# Patient Record
Sex: Male | Born: 1960 | Race: White | Hispanic: No | Marital: Married | Smoking: Current some day smoker
Health system: Southern US, Community
[De-identification: ages and names within clinical notes are randomized; demographics above are authoritative.]

## PROBLEM LIST (undated history)

## (undated) DIAGNOSIS — M1990 Unspecified osteoarthritis, unspecified site: Secondary | ICD-10-CM

## (undated) DIAGNOSIS — G709 Myoneural disorder, unspecified: Secondary | ICD-10-CM

## (undated) DIAGNOSIS — K219 Gastro-esophageal reflux disease without esophagitis: Secondary | ICD-10-CM

## (undated) DIAGNOSIS — E785 Hyperlipidemia, unspecified: Secondary | ICD-10-CM

## (undated) DIAGNOSIS — I10 Essential (primary) hypertension: Secondary | ICD-10-CM

## (undated) DIAGNOSIS — T8859XA Other complications of anesthesia, initial encounter: Secondary | ICD-10-CM

## (undated) DIAGNOSIS — T4145XA Adverse effect of unspecified anesthetic, initial encounter: Secondary | ICD-10-CM

## (undated) HISTORY — PX: MASTECTOMY: SHX3

---

## 2000-05-02 HISTORY — PX: PAROTIDECTOMY: SUR1003

## 2009-05-02 HISTORY — PX: HERNIA REPAIR: SHX51

## 2010-02-11 ENCOUNTER — Ambulatory Visit: Payer: Self-pay | Admitting: Surgery

## 2011-05-12 ENCOUNTER — Other Ambulatory Visit: Payer: Self-pay | Admitting: Orthopedic Surgery

## 2011-05-18 ENCOUNTER — Encounter (HOSPITAL_BASED_OUTPATIENT_CLINIC_OR_DEPARTMENT_OTHER): Payer: Self-pay | Admitting: *Deleted

## 2011-05-18 NOTE — Progress Notes (Signed)
Able to finally get 4pm-works nights To arrive 745am to get lab and ekg

## 2011-05-19 ENCOUNTER — Ambulatory Visit (HOSPITAL_BASED_OUTPATIENT_CLINIC_OR_DEPARTMENT_OTHER)
Admission: RE | Admit: 2011-05-19 | Discharge: 2011-05-19 | Disposition: A | Discharge status: Home / Self Care | Payer: Federal, State, Local not specified - PPO | Source: Ambulatory Visit | Attending: Orthopedic Surgery | Admitting: Orthopedic Surgery

## 2011-05-19 ENCOUNTER — Encounter (HOSPITAL_BASED_OUTPATIENT_CLINIC_OR_DEPARTMENT_OTHER): Payer: Self-pay | Admitting: *Deleted

## 2011-05-19 ENCOUNTER — Encounter (HOSPITAL_BASED_OUTPATIENT_CLINIC_OR_DEPARTMENT_OTHER): Payer: Self-pay | Admitting: Anesthesiology

## 2011-05-19 ENCOUNTER — Ambulatory Visit (HOSPITAL_BASED_OUTPATIENT_CLINIC_OR_DEPARTMENT_OTHER): Payer: Federal, State, Local not specified - PPO | Admitting: Anesthesiology

## 2011-05-19 ENCOUNTER — Encounter (HOSPITAL_BASED_OUTPATIENT_CLINIC_OR_DEPARTMENT_OTHER)
Admission: RE | Disposition: A | Discharge status: Home / Self Care | Payer: Self-pay | Source: Ambulatory Visit | Attending: Orthopedic Surgery

## 2011-05-19 ENCOUNTER — Encounter (HOSPITAL_BASED_OUTPATIENT_CLINIC_OR_DEPARTMENT_OTHER): Payer: Self-pay | Admitting: Orthopedic Surgery

## 2011-05-19 ENCOUNTER — Other Ambulatory Visit: Payer: Self-pay

## 2011-05-19 DIAGNOSIS — G5600 Carpal tunnel syndrome, unspecified upper limb: Secondary | ICD-10-CM | POA: Insufficient documentation

## 2011-05-19 DIAGNOSIS — I10 Essential (primary) hypertension: Secondary | ICD-10-CM | POA: Insufficient documentation

## 2011-05-19 DIAGNOSIS — E785 Hyperlipidemia, unspecified: Secondary | ICD-10-CM | POA: Insufficient documentation

## 2011-05-19 HISTORY — DX: Other complications of anesthesia, initial encounter: T88.59XA

## 2011-05-19 HISTORY — DX: Myoneural disorder, unspecified: G70.9

## 2011-05-19 HISTORY — PX: CARPAL TUNNEL RELEASE: SHX101

## 2011-05-19 HISTORY — DX: Hyperlipidemia, unspecified: E78.5

## 2011-05-19 HISTORY — DX: Adverse effect of unspecified anesthetic, initial encounter: T41.45XA

## 2011-05-19 HISTORY — DX: Essential (primary) hypertension: I10

## 2011-05-19 LAB — POCT I-STAT, CHEM 8
BUN: 10 mg/dL (ref 6–23)
Creatinine, Ser: 1 mg/dL (ref 0.50–1.35)
Hemoglobin: 16 g/dL (ref 13.0–17.0)
Potassium: 4.2 mEq/L (ref 3.5–5.1)
Sodium: 136 mEq/L (ref 135–145)

## 2011-05-19 SURGERY — CARPAL TUNNEL RELEASE
Anesthesia: General | Site: Wrist | Laterality: Right | Wound class: Clean

## 2011-05-19 MED ORDER — LIDOCAINE HCL (CARDIAC) 20 MG/ML IV SOLN
INTRAVENOUS | Status: DC | PRN
  Administered 2011-05-19: 60 mg via INTRAVENOUS

## 2011-05-19 MED ORDER — CHLORHEXIDINE GLUCONATE 4 % EX LIQD: 60.0000 mL | Freq: Once | CUTANEOUS | Status: DC

## 2011-05-19 MED ORDER — LACTATED RINGERS IV SOLN
INTRAVENOUS | Status: DC
  Administered 2011-05-19 (×2): via INTRAVENOUS

## 2011-05-19 MED ORDER — ONDANSETRON HCL 4 MG/2ML IJ SOLN
INTRAMUSCULAR | Status: DC | PRN
  Administered 2011-05-19: 4 mg via INTRAVENOUS

## 2011-05-19 MED ORDER — MEPERIDINE HCL 25 MG/ML IJ SOLN: 6.2500 mg | INTRAMUSCULAR | Status: DC | PRN

## 2011-05-19 MED ORDER — MIDAZOLAM HCL 5 MG/5ML IJ SOLN
INTRAMUSCULAR | Status: DC | PRN
  Administered 2011-05-19: 2 mg via INTRAVENOUS

## 2011-05-19 MED ORDER — LIDOCAINE HCL 2 % IJ SOLN
INTRAMUSCULAR | Status: DC | PRN
  Administered 2011-05-19: 4 mL

## 2011-05-19 MED ORDER — FENTANYL CITRATE 0.05 MG/ML IJ SOLN
INTRAMUSCULAR | Status: DC | PRN
  Administered 2011-05-19: 100 ug via INTRAVENOUS

## 2011-05-19 MED ORDER — PROPOFOL 10 MG/ML IV EMUL
INTRAVENOUS | Status: DC | PRN
  Administered 2011-05-19: 200 mg via INTRAVENOUS

## 2011-05-19 MED ORDER — PROMETHAZINE HCL 25 MG/ML IJ SOLN: 6.2500 mg | INTRAMUSCULAR | Status: DC | PRN

## 2011-05-19 MED ORDER — FENTANYL CITRATE 0.05 MG/ML IJ SOLN: 25.0000 ug | INTRAMUSCULAR | Status: DC | PRN

## 2011-05-19 MED ORDER — HYDROCODONE-ACETAMINOPHEN 5-325 MG PO TABS: ORAL_TABLET | ORAL | Status: AC

## 2011-05-19 MED ORDER — DEXAMETHASONE SODIUM PHOSPHATE 10 MG/ML IJ SOLN
INTRAMUSCULAR | Status: DC | PRN
  Administered 2011-05-19: 8 mg via INTRAVENOUS

## 2011-05-19 SURGICAL SUPPLY — 41 items
BANDAGE ADHESIVE 1X3 (GAUZE/BANDAGES/DRESSINGS) IMPLANT
BANDAGE ELASTIC 3 VELCRO ST LF (GAUZE/BANDAGES/DRESSINGS) ×2 IMPLANT
BLADE SURG 15 STRL LF DISP TIS (BLADE) ×1 IMPLANT
BLADE SURG 15 STRL SS (BLADE) ×1
BNDG ESMARK 4X9 LF (GAUZE/BANDAGES/DRESSINGS) ×2 IMPLANT
BRUSH SCRUB EZ PLAIN DRY (MISCELLANEOUS) ×2 IMPLANT
CLOSURE STERI STRIP 1/2 X4 (GAUZE/BANDAGES/DRESSINGS) ×2 IMPLANT
CLOTH BEACON ORANGE TIMEOUT ST (SAFETY) ×2 IMPLANT
CORDS BIPOLAR (ELECTRODE) ×2 IMPLANT
COVER MAYO STAND STRL (DRAPES) ×2 IMPLANT
COVER TABLE BACK 60X90 (DRAPES) ×2 IMPLANT
CUFF TOURNIQUET SINGLE 18IN (TOURNIQUET CUFF) IMPLANT
DECANTER SPIKE VIAL GLASS SM (MISCELLANEOUS) ×2 IMPLANT
DRAPE EXTREMITY T 121X128X90 (DRAPE) ×2 IMPLANT
DRAPE SURG 17X23 STRL (DRAPES) ×2 IMPLANT
GLOVE BIO SURGEON STRL SZ 6.5 (GLOVE) ×4 IMPLANT
GLOVE BIOGEL M STRL SZ7.5 (GLOVE) ×2 IMPLANT
GLOVE ORTHO TXT STRL SZ7.5 (GLOVE) ×2 IMPLANT
GOWN PREVENTION PLUS XLARGE (GOWN DISPOSABLE) ×2 IMPLANT
GOWN PREVENTION PLUS XXLARGE (GOWN DISPOSABLE) ×4 IMPLANT
NEEDLE 27GAX1X1/2 (NEEDLE) ×2 IMPLANT
PACK BASIN DAY SURGERY FS (CUSTOM PROCEDURE TRAY) ×2 IMPLANT
PAD CAST 3X4 CTTN HI CHSV (CAST SUPPLIES) ×1 IMPLANT
PADDING CAST ABS 4INX4YD NS (CAST SUPPLIES) ×1
PADDING CAST ABS COTTON 4X4 ST (CAST SUPPLIES) ×1 IMPLANT
PADDING CAST COTTON 3X4 STRL (CAST SUPPLIES) ×1
PADDING WEBRIL 3 STERILE (GAUZE/BANDAGES/DRESSINGS) ×2 IMPLANT
SPLINT PLASTER CAST XFAST 3X15 (CAST SUPPLIES) ×5 IMPLANT
SPLINT PLASTER EXTRA FAST 3X15 (CAST SUPPLIES) ×1
SPLINT PLASTER GYPS XFAST 3X15 (CAST SUPPLIES) ×1 IMPLANT
SPLINT PLASTER XTRA FASTSET 3X (CAST SUPPLIES) ×5
SPONGE GAUZE 4X4 12PLY (GAUZE/BANDAGES/DRESSINGS) ×2 IMPLANT
SPONGE GAUZE 4X4 16PLY NONSTR (GAUZE/BANDAGES/DRESSINGS) ×2 IMPLANT
STOCKINETTE 4X48 STRL (DRAPES) ×2 IMPLANT
STRIP CLOSURE SKIN 1/2X4 (GAUZE/BANDAGES/DRESSINGS) ×2 IMPLANT
SUT PROLENE 3 0 PS 2 (SUTURE) ×2 IMPLANT
SYR 3ML 23GX1 SAFETY (SYRINGE) IMPLANT
SYR CONTROL 10ML LL (SYRINGE) ×2 IMPLANT
TRAY DSU PREP LF (CUSTOM PROCEDURE TRAY) ×2 IMPLANT
UNDERPAD 30X30 INCONTINENT (UNDERPADS AND DIAPERS) ×2 IMPLANT
WATER STERILE IRR 1000ML POUR (IV SOLUTION) ×2 IMPLANT

## 2011-05-19 NOTE — Anesthesia Procedure Notes (Addendum)
Date/Time: 05/19/2011 10:07 AM Performed by: Radford Pax   Procedure Name: LMA Insertion Date/Time: 05/19/2011 10:07 AM Performed by: Radford Pax Pre-anesthesia Checklist: Patient identified, Emergency Drugs available, Suction available, Patient being monitored and Timeout performed Patient Re-evaluated:Patient Re-evaluated prior to inductionOxygen Delivery Method: Circle System Utilized Preoxygenation: Pre-oxygenation with 100% oxygen Intubation Type: IV induction Ventilation: Mask ventilation without difficulty LMA: LMA with gastric port inserted LMA Size: 4.0 Number of attempts: 1 (atraumatic) Placement Confirmation: positive ETCO2 Tube secured with: Tape Dental Injury: Teeth and Oropharynx as per pre-operative assessment

## 2011-05-19 NOTE — Transfer of Care (Signed)
Immediate Anesthesia Transfer of Care Note  Patient: Raymond Cooley  Procedure(s) Performed:  CARPAL TUNNEL RELEASE  Patient Location: PACU  Anesthesia Type: General  Level of Consciousness: awake, alert , oriented and patient cooperative  Airway & Oxygen Therapy: Patient Spontanous Breathing and Patient connected to face mask oxygen  Post-op Assessment: Report given to PACU RN and Post -op Vital signs reviewed and stable  Post vital signs: Reviewed and stable Filed Vitals:   05/19/11 0830  BP: 118/75  Pulse:   Temp:   Resp: 20    Complications: No apparent anesthesia complications

## 2011-05-19 NOTE — H&P (Signed)
Raymond Cooley is an 51 y.o. male.   Chief Complaint: Complaining of chronic and progressive right hand numbness and tingling HPI: Patient is a 51 year old right-hand-dominant male who presented to our office in early January complaining of a 5+ year history of chronic and progressive numbness and tingling in both hands. He had nerve conduction studies that revealed a moderate to severe bilateral carpal tunnel syndrome. She's tried a long of conservative measures including splinting and anti-inflammatories. This continues to awaken the patient at night. He wishes to proceed with surgical intervention.  Past Medical History  Diagnosis Date  . Hypertension   . Hyperlipemia   . Complication of anesthesia     difficulty voiding  . Neuromuscular disorder     carpal tunnel    Past Surgical History  Procedure Date  . Hernia repair 2011    rupured umb hernia-  . Parotidectomy 2002    rt    History reviewed. No pertinent family history. Social History:  reports that he has been smoking.  He does not have any smokeless tobacco history on file. He reports that he drinks alcohol. He reports that he does not use illicit drugs.  Allergies: No Known Allergies  Medications Prior to Admission  Medication Dose Route Frequency Provider Last Rate Last Dose  . chlorhexidine (HIBICLENS) 4 % liquid 4 application  60 mL Topical Once       . lactated ringers infusion   Intravenous Continuous Germaine Pomfret, MD 20 mL/hr at 05/19/11 0855     Medications Prior to Admission  Medication Sig Dispense Refill  . acetaminophen (TYLENOL) 500 MG tablet Take 500 mg by mouth every 6 (six) hours as needed.      . gabapentin (NEURONTIN) 400 MG capsule Take 400 mg by mouth 2 times daily at 12 noon and 4 pm.      . ibuprofen (ADVIL,MOTRIN) 400 MG tablet Take 400 mg by mouth every 6 (six) hours as needed.      Marland Kitchen lisinopril (PRINIVIL,ZESTRIL) 10 MG tablet Take 10 mg by mouth daily.      Marland Kitchen omeprazole (PRILOSEC) 20  MG capsule Take 20 mg by mouth daily.        No results found for this or any previous visit (from the past 48 hour(s)).  No results found.   Pertinent items are noted in HPI.  Blood pressure 118/75, pulse 65, temperature 97.6 F (36.4 C), temperature source Oral, resp. rate 20, height 5\' 11"  (1.803 m), weight 133.811 kg (295 lb), SpO2 95.00%.  General appearance: alert Head: Normocephalic, without obvious abnormality Neck: supple, symmetrical, trachea midline Resp: clear to auscultation bilaterally Cardio: regular rate and rhythm, S1, S2 normal, no murmur, click, rub or gallop GI: normal findings: bowel sounds normal Extremities: Examination of his hands reveals a positive Phalen's and positive Tinel's bilaterally. He has full range of motion of his fingers and wrist. He has normal sweat pattern. Review of his nerve conduction studies reveals a moderate to severe bilateral carpal tunnel syndrome. Pulses: 2+ and symmetric Skin: normal Neurologic: Grossly normal    Assessment/Plan Impression is bilateral carpal tunnel syndrome  Plan patient to be taken to the operating room to undergo right carpal tunnel release. The procedure risks benefits and postoperative course were discussed with the patient at length and he was in agreement with this plan.  DASNOIT,Dushaun Okey J 05/19/2011, 8:59 AM    H&P documentation: 05/19/2011  -History and Physical Reviewed  -Patient has been re-examined  -No change in the  plan of care  Wyn Forster, MD

## 2011-05-19 NOTE — Anesthesia Postprocedure Evaluation (Signed)
  Anesthesia Post-op Note  Patient: Raymond Cooley  Procedure(s) Performed:  CARPAL TUNNEL RELEASE  Patient Location: PACU  Anesthesia Type: General  Level of Consciousness: awake and alert   Airway and Oxygen Therapy: Patient Spontanous Breathing and Patient connected to face mask oxygen  Post-op Pain: mild  Post-op Assessment: Post-op Vital signs reviewed, Patient's Cardiovascular Status Stable, Respiratory Function Stable and Patent Airway  Post-op Vital Signs: Reviewed and stable  Complications: No apparent anesthesia complications

## 2011-05-19 NOTE — Brief Op Note (Signed)
05/19/2011  10:22 AM  PATIENT:  Raymond Cooley  51 y.o. male  PRE-OPERATIVE DIAGNOSIS:  right carpal tunnel syndrome (severe)  POST-OPERATIVE DIAGNOSIS:  Right carpal tunnel syndrome (severe)  PROCEDURE:  Procedure(s): CARPAL TUNNEL RELEASE  RIGHT HAND  SURGEON:  Surgeon(s): Wyn Forster., MD  PHYSICIAN ASSISTANT:   ASSISTANTS: Mallory Shirk.A-C   ANESTHESIA:   general  EBL:  Total I/O In: 500 [I.V.:500] Out: -   BLOOD ADMINISTERED:none  DRAINS: none   LOCAL MEDICATIONS USED:  XYLOCAINE 3 CC  SPECIMEN:  No Specimen  DISPOSITION OF SPECIMEN:  N/A  COUNTS:  YES  TOURNIQUET:  * Missing tourniquet times found for documented tourniquets in log:  [id number] *  DICTATION: .Other Dictation: Dictation Number [id number]  PLAN OF CARE: Discharge to home after PACU  PATIENT DISPOSITION:  PACU - hemodynamically stable.   Delay start of Pharmacological VTE agent (>24hrs) due to surgical blood loss or risk of bleeding:  NO/NOT APPLICABLE

## 2011-05-19 NOTE — Op Note (Signed)
Op note dictated:  05/19/11  [id number]

## 2011-05-19 NOTE — Anesthesia Preprocedure Evaluation (Signed)
Anesthesia Evaluation  Patient identified by MRN, date of birth, ID band Patient awake    Reviewed: Allergy & Precautions, H&P , NPO status , Patient's Chart, lab work & pertinent test results  Airway Mallampati: II TM Distance: >3 FB Neck ROM: Full    Dental No notable dental hx. (+) Teeth Intact   Pulmonary neg pulmonary ROS,  clear to auscultation  Pulmonary exam normal       Cardiovascular hypertension, On Medications Regular Normal    Neuro/Psych  Neuromuscular disease Negative Neurological ROS  Negative Psych ROS   GI/Hepatic negative GI ROS, Neg liver ROS,   Endo/Other  Negative Endocrine ROSMorbid obesity  Renal/GU negative Renal ROS  Genitourinary negative   Musculoskeletal   Abdominal (+) obese,   Peds  Hematology negative hematology ROS (+)   Anesthesia Other Findings   Reproductive/Obstetrics negative OB ROS                           Anesthesia Physical Anesthesia Plan  ASA: III  Anesthesia Plan: General   Post-op Pain Management:    Induction: Intravenous  Airway Management Planned: LMA  Additional Equipment:   Intra-op Plan:   Post-operative Plan: Extubation in OR  Informed Consent: I have reviewed the patients History and Physical, chart, labs and discussed the procedure including the risks, benefits and alternatives for the proposed anesthesia with the patient or authorized representative who has indicated his/her understanding and acceptance.     Plan Discussed with: CRNA and Surgeon  Anesthesia Plan Comments:         Anesthesia Quick Evaluation

## 2011-05-20 NOTE — Op Note (Signed)
NAME:  Raymond Cooley, Raymond Cooley NO.:  [id number]  MEDICAL RECORD NO.:  [id number]  LOCATION:                                 FACILITY:  PHYSICIAN:  Katy Fitch. Jenkins Risdon, M.D.      DATE OF BIRTH:  DATE OF PROCEDURE:  05/19/2011 DATE OF DISCHARGE:                              OPERATIVE REPORT   PREOPERATIVE DIAGNOSIS:  Chronic severe right carpal tunnel syndrome.  POSTOPERATIVE DIAGNOSIS:  Chronic severe right carpal tunnel syndrome.  OPERATION:  Release of right transverse carpal ligament.  OPERATING SURGEON:  Katy Fitch. Shawntell Dixson, MD  ASSISTANT:  Annye Rusk, PA-C  ANESTHESIA:  General by LMA.  SUPERVISING ANESTHESIOLOGIST:  Zenon Mayo, MD  INDICATIONS:  Niv Darley is a 51 year old gentleman referred through the courtesy of Dr. Evelene Croon of the [facility] for evaluation and management of bilateral hand numbness. 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  Electrodiagnostic studies revealed severe carpal tunnel syndrome on the right, moderately severe on the left.  We advised Mr. Eisenhour to undergo release of his right transverse carpal ligament.  Preoperatively, he was reminded of the potential risks and benefits of surgery.  Question regarding the anticipated procedure were invited and answered in detail.  He understands he will need to wait 3-4 months to see the full benefit of surgery due to his severe entrapment neuropathy at this time.  Preoperatively, he was interviewed in the holding area.  Questions were invited and answered in detail.  PROCEDURE:  Drew Lips was brought to room 1 of the [facility] and placed in supine position upon the operating table.  Following the induction of general anesthesia by LMA technique under Dr. Jarrett Ables direct supervision, the right arm was prepped with Betadine soap and solution and sterilely draped.  A  pneumatic tourniquet was applied to the proximal right brachium.  Following exsanguination of the right arm with Esmarch bandage, the arterial tourniquet was inflated to 220 mmHg.  Procedure commenced with a routine surgical time-out.  A short incision was fashioned in line of the ring finger in the palm. Subcutaneous tissues were carefully divided revealing the palmar fascia. This was split in line of its fibers to reveal the common sensory branches of the median nerve and the distal margin of the transverse carpal ligament.  The carpal canal was sounded with a Penfield 4 elevator followed by release of the transverse carpal ligament along its ulnar border extending into the distal forearm.  This widely opened the carpal canal.  No masses or other predicaments were noted.  Bleeding points along the margin of the released ligament were electrocauterized with bipolar current followed by repair of the skin with intradermal 3-0 Prolene suture.  A compressive dressing was applied with a volar plaster splint maintaining the wrist in 5 degrees of dorsiflexion.  For aftercare, Mr. Chiappetta  is provided prescription for hydrocodone and Tylenol 5/325 one p.o. q.4-6 h. p.r.n. pain, 20 tablets without refill.     Katy Fitch Janki Dike, M.D.     RVS/MEDQ  D:  05/19/2011  T:  05/20/2011  Job:  [id number]  cc:   Evelene Croon, MD

## 2011-05-23 ENCOUNTER — Encounter (HOSPITAL_BASED_OUTPATIENT_CLINIC_OR_DEPARTMENT_OTHER): Payer: Self-pay | Admitting: Orthopedic Surgery

## 2011-05-23 NOTE — Addendum Note (Signed)
Addendum  created 05/23/11 1010 by Jewel Baize Annabell Oconnor, CRNA   Modules edited:Anesthesia Events

## 2011-06-06 ENCOUNTER — Emergency Department (HOSPITAL_COMMUNITY): Payer: Federal, State, Local not specified - PPO

## 2011-06-06 ENCOUNTER — Inpatient Hospital Stay (HOSPITAL_COMMUNITY)
Admission: EM | Admit: 2011-06-06 | Discharge: 2011-06-09 | DRG: 494 | Disposition: A | Discharge status: Home / Self Care | Payer: Federal, State, Local not specified - PPO | Attending: General Surgery | Admitting: General Surgery

## 2011-06-06 ENCOUNTER — Encounter (HOSPITAL_COMMUNITY): Payer: Self-pay | Admitting: *Deleted

## 2011-06-06 ENCOUNTER — Other Ambulatory Visit: Payer: Self-pay

## 2011-06-06 DIAGNOSIS — F17200 Nicotine dependence, unspecified, uncomplicated: Secondary | ICD-10-CM | POA: Diagnosis present

## 2011-06-06 DIAGNOSIS — R1011 Right upper quadrant pain: Secondary | ICD-10-CM

## 2011-06-06 DIAGNOSIS — Z79899 Other long term (current) drug therapy: Secondary | ICD-10-CM

## 2011-06-06 DIAGNOSIS — K810 Acute cholecystitis: Principal | ICD-10-CM

## 2011-06-06 DIAGNOSIS — E785 Hyperlipidemia, unspecified: Secondary | ICD-10-CM | POA: Diagnosis present

## 2011-06-06 DIAGNOSIS — K5900 Constipation, unspecified: Secondary | ICD-10-CM | POA: Diagnosis present

## 2011-06-06 DIAGNOSIS — I10 Essential (primary) hypertension: Secondary | ICD-10-CM | POA: Diagnosis present

## 2011-06-06 HISTORY — DX: Unspecified osteoarthritis, unspecified site: M19.90

## 2011-06-06 HISTORY — DX: Gastro-esophageal reflux disease without esophagitis: K21.9

## 2011-06-06 LAB — COMPREHENSIVE METABOLIC PANEL
ALT: 21 U/L (ref 0–53)
Albumin: 3.4 g/dL — ABNORMAL LOW (ref 3.5–5.2)
Alkaline Phosphatase: 90 U/L (ref 39–117)
BUN: 9 mg/dL (ref 6–23)
Chloride: 92 mEq/L — ABNORMAL LOW (ref 96–112)
Potassium: 3.4 mEq/L — ABNORMAL LOW (ref 3.5–5.1)
Sodium: 131 mEq/L — ABNORMAL LOW (ref 135–145)
Total Bilirubin: 1.2 mg/dL (ref 0.3–1.2)

## 2011-06-06 LAB — DIFFERENTIAL
Basophils Absolute: 0 10*3/uL (ref 0.0–0.1)
Basophils Relative: 0 % (ref 0–1)
Eosinophils Absolute: 0.2 10*3/uL (ref 0.0–0.7)
Eosinophils Relative: 1 % (ref 0–5)
Lymphocytes Relative: 6 % — ABNORMAL LOW (ref 12–46)
Monocytes Absolute: 2.2 10*3/uL — ABNORMAL HIGH (ref 0.1–1.0)

## 2011-06-06 LAB — LIPASE, BLOOD: Lipase: 16 U/L (ref 11–59)

## 2011-06-06 LAB — CBC
HCT: 40.2 % (ref 39.0–52.0)
MCH: 29.4 pg (ref 26.0–34.0)
MCHC: 35.1 g/dL (ref 30.0–36.0)
MCV: 83.8 fL (ref 78.0–100.0)
Platelets: 293 10*3/uL (ref 150–400)
RDW: 13.6 % (ref 11.5–15.5)
WBC: 18.9 10*3/uL — ABNORMAL HIGH (ref 4.0–10.5)

## 2011-06-06 MED ORDER — HYDROMORPHONE HCL PF 1 MG/ML IJ SOLN
1.0000 mg | Freq: Once | INTRAMUSCULAR | Status: AC
  Administered 2011-06-06: 1 mg via INTRAVENOUS
  Filled 2011-06-06: qty 1

## 2011-06-06 MED ORDER — SODIUM CHLORIDE 0.9 % IV BOLUS (SEPSIS)
1000.0000 mL | Freq: Once | INTRAVENOUS | Status: AC
  Administered 2011-06-06: 1000 mL via INTRAVENOUS

## 2011-06-06 MED ORDER — PANTOPRAZOLE SODIUM 40 MG IV SOLR
40.0000 mg | Freq: Every day | INTRAVENOUS | Status: DC
  Administered 2011-06-07 – 2011-06-08 (×2): 40 mg via INTRAVENOUS
  Filled 2011-06-06 (×3): qty 40

## 2011-06-06 MED ORDER — SODIUM CHLORIDE 0.9 % IV SOLN
3.0000 g | Freq: Once | INTRAVENOUS | Status: AC
  Administered 2011-06-06: 3 g via INTRAVENOUS
  Filled 2011-06-06: qty 3

## 2011-06-06 MED ORDER — HYDROMORPHONE HCL PF 1 MG/ML IJ SOLN
1.0000 mg | INTRAMUSCULAR | Status: DC | PRN
  Administered 2011-06-06 – 2011-06-07 (×2): 2 mg via INTRAVENOUS
  Administered 2011-06-07: 1 mg via INTRAVENOUS
  Administered 2011-06-07 (×2): 2 mg via INTRAVENOUS
  Administered 2011-06-07 – 2011-06-08 (×4): 1 mg via INTRAVENOUS
  Administered 2011-06-08 – 2011-06-09 (×3): 2 mg via INTRAVENOUS
  Filled 2011-06-06: qty 2
  Filled 2011-06-06: qty 1
  Filled 2011-06-06: qty 2
  Filled 2011-06-06: qty 1
  Filled 2011-06-06: qty 2
  Filled 2011-06-06 (×2): qty 1
  Filled 2011-06-06 (×3): qty 2
  Filled 2011-06-06: qty 1
  Filled 2011-06-06: qty 2

## 2011-06-06 MED ORDER — GABAPENTIN 400 MG PO CAPS
400.0000 mg | ORAL_CAPSULE | Freq: Two times a day (BID) | ORAL | Status: DC
Start: 2011-06-07 — End: 2011-06-09
  Administered 2011-06-08 – 2011-06-09 (×2): 400 mg via ORAL
  Filled 2011-06-06 (×6): qty 1

## 2011-06-06 MED ORDER — LISINOPRIL-HYDROCHLOROTHIAZIDE 10-12.5 MG PO TABS: 1.0000 | ORAL_TABLET | Freq: Every day | ORAL | Status: DC

## 2011-06-06 MED ORDER — AMPICILLIN-SULBACTAM SODIUM 3 (2-1) G IJ SOLR
3.0000 g | Freq: Four times a day (QID) | INTRAMUSCULAR | Status: DC
  Administered 2011-06-06 – 2011-06-09 (×9): 3 g via INTRAVENOUS
  Filled 2011-06-06 (×14): qty 3

## 2011-06-06 MED ORDER — GI COCKTAIL ~~LOC~~
30.0000 mL | Freq: Once | ORAL | Status: AC
  Administered 2011-06-06: 30 mL via ORAL
  Filled 2011-06-06: qty 30

## 2011-06-06 MED ORDER — ONDANSETRON HCL 4 MG/2ML IJ SOLN
4.0000 mg | Freq: Four times a day (QID) | INTRAMUSCULAR | Status: DC | PRN
  Administered 2011-06-06 – 2011-06-07 (×3): 4 mg via INTRAVENOUS
  Filled 2011-06-06 (×3): qty 2

## 2011-06-06 MED ORDER — LISINOPRIL 10 MG PO TABS
10.0000 mg | ORAL_TABLET | Freq: Every day | ORAL | Status: DC
  Administered 2011-06-08 – 2011-06-09 (×2): 10 mg via ORAL
  Filled 2011-06-06 (×3): qty 1

## 2011-06-06 MED ORDER — POTASSIUM CHLORIDE IN NACL 20-0.9 MEQ/L-% IV SOLN
INTRAVENOUS | Status: DC
  Administered 2011-06-06: via INTRAVENOUS
  Administered 2011-06-07: 100 mL/h via INTRAVENOUS
  Administered 2011-06-08 – 2011-06-09 (×2): via INTRAVENOUS
  Filled 2011-06-06 (×9): qty 1000

## 2011-06-06 MED ORDER — ACETAMINOPHEN 500 MG PO TABS
500.0000 mg | ORAL_TABLET | Freq: Four times a day (QID) | ORAL | Status: DC | PRN
  Filled 2011-06-06: qty 1

## 2011-06-06 MED ORDER — HYDROCHLOROTHIAZIDE 12.5 MG PO CAPS
12.5000 mg | ORAL_CAPSULE | Freq: Every day | ORAL | Status: DC
  Administered 2011-06-08: 10:00:00 via ORAL
  Administered 2011-06-09: 12.5 mg via ORAL
  Filled 2011-06-06 (×3): qty 1

## 2011-06-06 MED ORDER — ONDANSETRON HCL 4 MG/2ML IJ SOLN
4.0000 mg | Freq: Once | INTRAMUSCULAR | Status: AC
  Administered 2011-06-06: 4 mg via INTRAVENOUS
  Filled 2011-06-06: qty 2

## 2011-06-06 MED ORDER — IOHEXOL 350 MG/ML SOLN: 100.0000 mL | Freq: Once | INTRAVENOUS | Status: DC | PRN

## 2011-06-06 NOTE — ED Provider Notes (Signed)
History     CSN: [id number]  Arrival date & time 06/06/11  1035   First MD Initiated Contact with Patient 06/06/11 1146      Chief Complaint  Patient presents with  . Back Pain  . Abdominal Pain  . Weakness    HPI The patient presents with 5 days of pain.  He notes that his pain began gradually, initially in his back between his shoulder blades.  Since onset the pain has radiated to include his epigastric area.  During this illness he has had nausea, anorexia, mild dyspnea secondary to the pain.  He describes the pain is sore/sharp, the worsening of his life.  He has achieved minimal relief with oral narcotics No anterior chest pain, no fevers, no chills, no diarrhea.  Patient notes constipation, minimally improved with enema/laxatives. No extremity numbness/weakness/tingling.  The patient notes that this pain is dissimilar to his prior sciatica events. Past Medical History  Diagnosis Date  . Hypertension   . Hyperlipemia   . Complication of anesthesia     difficulty voiding  . Neuromuscular disorder     carpal tunnel    Past Surgical History  Procedure Date  . Hernia repair 2011    rupured umb hernia-  . Parotidectomy 2002    rt  . Carpal tunnel release 05/19/2011    Procedure: CARPAL TUNNEL RELEASE;  Surgeon: Wyn Forster., MD;  Location: [facility];  Service: Orthopedics;  Laterality: Right;  . Mastectomy     History reviewed. No pertinent family history.  History  Substance Use Topics  . Smoking status: Current Everyday Smoker -- 0.2 packs/day  . Smokeless tobacco: Not on file  . Alcohol Use: Yes     occ      Review of Systems  Constitutional:       Per HPI, otherwise negative  HENT:       Per HPI, otherwise negative  Eyes: Negative.   Respiratory:       Per HPI, otherwise negative  Cardiovascular:       Per HPI, otherwise negative  Gastrointestinal: Negative for vomiting.  Genitourinary: Negative.   Musculoskeletal:       Per  HPI, otherwise negative  Skin: Negative.   Neurological: Negative for syncope.    Allergies  Review of patient's allergies indicates no known allergies.  Home Medications   Current Outpatient Rx  Name Route Sig Dispense Refill  . ACETAMINOPHEN 500 MG PO TABS Oral Take 500 mg by mouth every 6 (six) hours as needed. For pain    . DOCUSATE SODIUM 100 MG PO CAPS Oral Take 200 mg by mouth 2 (two) times daily as needed. For constipation    . GABAPENTIN 400 MG PO CAPS Oral Take 400 mg by mouth 2 times daily at 12 noon and 4 pm.    . HYDROCODONE-ACETAMINOPHEN 5-325 MG PO TABS Oral Take 2 tablets by mouth every 6 (six) hours as needed. For pain    . IBUPROFEN 400 MG PO TABS Oral Take 400 mg by mouth every 6 (six) hours as needed. For pain    . LISINOPRIL-HYDROCHLOROTHIAZIDE 10-12.5 MG PO TABS Oral Take 1 tablet by mouth daily.    Marland Kitchen OMEPRAZOLE 20 MG PO CPDR Oral Take 20 mg by mouth daily.    Marland Kitchen POLYETHYLENE GLYCOL 3350 PO PACK Oral Take 17 g by mouth daily.      BP 152/70  Pulse 73  Temp(Src) 97.3 F (36.3 C) (Oral)  Resp 22  SpO2 100%  Physical Exam  Nursing note and vitals reviewed. Constitutional: He is oriented to person, place, and time. He appears well-developed. No distress.  HENT:  Head: Normocephalic and atraumatic.  Eyes: Conjunctivae and EOM are normal.  Cardiovascular: Normal rate and regular rhythm.   Pulmonary/Chest: Effort normal. No stridor. No respiratory distress.  Abdominal: He exhibits no distension. There is tenderness in the right lower quadrant, epigastric area and periumbilical area. There is guarding. There is no rigidity and no rebound.       Obese abdomen  Musculoskeletal: He exhibits no edema.       Pulses symmetric in extremities  Neurological: He is alert and oriented to person, place, and time.  Skin: Skin is warm and dry.  Psychiatric: He has a normal mood and affect.    ED Course  Procedures (including critical care time)   Labs Reviewed    COMPREHENSIVE METABOLIC PANEL  CBC  DIFFERENTIAL  LIPASE, BLOOD  TROPONIN I   No results found.   No diagnosis found.    Date: 06/06/2011  Rate: 69  Rhythm: normal sinus rhythm  QRS Axis: normal  Intervals: normal  ST/T Wave abnormalities: nonspecific T wave changes  Conduction Disutrbances:none  Narrative Interpretation:   Old EKG Reviewed: changes noted  New T-wave inversion in III    A/P: In this obese male with multiple medical problems, now with several days of in her scapular, abdominal pain, there is concern for dissection or intra-abdominal pathology.  Low suspicion for cardiac etiology, given the absence of progression or any anterior chest pain.  CT, XR both reviewed by me.  MDM  This 51 year old male presents with several days of pain.  The patient's initial description of pain between his shoulder blades with radiation towards his epigastrium is concern for dissection, particularly given the patient's history of hypertension and hyperlipidemia, as well as his paternal history of significant aortic aneurysm.  Given the patient's elevated d-dimer, he underwent angiography.  Angiography was notable for the absence of aortic dissection, or bowel obstruction, which had been suggested on the initial plain film.  Given the patient's leukocytosis, his abdominal pain, the CAT scan suggestive of hepatobiliary disease the patient had an ultrasound.  Ultrasound is notable for acute calculus cholecystitis.  Interestingly, the patient's labs do not demonstrate typical findings for this process.  However, the notable leukocytosis is consistent with this disease.  Given these findings the patient received IV antibiotics, and was admitted to the surgical service for further evaluation and management.  CRITICAL CARE Performed by: Gerhard Munch   Total critical care time: 35  Critical care time was exclusive of separately billable procedures and treating other  patients.  Critical care was necessary to treat or prevent imminent or life-threatening deterioration.  Critical care was time spent personally by me on the following activities: development of treatment plan with patient and/or surrogate as well as nursing, discussions with consultants, evaluation of patient's response to treatment, examination of patient, obtaining history from patient or surrogate, ordering and performing treatments and interventions, ordering and review of laboratory studies, ordering and review of radiographic studies, pulse oximetry and re-evaluation of patient's condition.         Gerhard Munch, MD 06/06/11 1750

## 2011-06-06 NOTE — H&P (Signed)
Raymond Cooley is an 51 y.o. male.   Chief Complaint: Abdominal pain and acute cholecystitis and cholelithiasis HPI: Five days of abdominal pain associated with nausea, vomiting and constipation.  Little to eat over the last several days.  Mostly RUQ and epigastric and upper back pain.  Similar episode in December.  No fevers or chill, no jaundice.  Previous umbilical hernia repair with mesh.  Past Medical History  Diagnosis Date  . Hypertension   . Hyperlipemia   . Complication of anesthesia     difficulty voiding  . Neuromuscular disorder     carpal tunnel    Past Surgical History  Procedure Date  . Hernia repair 2011    rupured umb hernia-  . Parotidectomy 2002    rt  . Carpal tunnel release 05/19/2011    Procedure: CARPAL TUNNEL RELEASE;  Surgeon: Wyn Forster., MD;  Location: [facility];  Service: Orthopedics;  Laterality: Right;  . Mastectomy     History reviewed. No pertinent family history. Social History:  reports that he has been smoking.  He does not have any smokeless tobacco history on file. He reports that he drinks alcohol. He reports that he does not use illicit drugs.  Allergies: No Known Allergies  Medications Prior to Admission  Medication Dose Route Frequency Provider Last Rate Last Dose  . Ampicillin-Sulbactam (UNASYN) 3 g in sodium chloride 0.9 % 100 mL IVPB  3 g Intravenous Once Gerhard Munch, MD   3 g at 06/06/11 1657  . gi cocktail  30 mL Oral Once Gerhard Munch, MD   30 mL at 06/06/11 1221  . HYDROmorphone (DILAUDID) injection 1 mg  1 mg Intravenous Once Gerhard Munch, MD   1 mg at 06/06/11 1218  . HYDROmorphone (DILAUDID) injection 1 mg  1 mg Intravenous Once Gerhard Munch, MD   1 mg at 06/06/11 1331  . HYDROmorphone (DILAUDID) injection 1 mg  1 mg Intravenous Once Gerhard Munch, MD   1 mg at 06/06/11 1536  . HYDROmorphone (DILAUDID) injection 1 mg  1 mg Intravenous Once Gerhard Munch, MD   1 mg at 06/06/11 1659  .  iohexol (OMNIPAQUE) 350 MG/ML injection 100 mL  100 mL Intravenous Once PRN Medication Radiologist, MD      . ondansetron (ZOFRAN) injection 4 mg  4 mg Intravenous Once Gerhard Munch, MD   4 mg at 06/06/11 1218  . sodium chloride 0.9 % bolus 1,000 mL  1,000 mL Intravenous Once Gerhard Munch, MD   1,000 mL at 06/06/11 1217   Medications Prior to Admission  Medication Sig Dispense Refill  . acetaminophen (TYLENOL) 500 MG tablet Take 500 mg by mouth every 6 (six) hours as needed. For pain      . gabapentin (NEURONTIN) 400 MG capsule Take 400 mg by mouth 2 times daily at 12 noon and 4 pm.      . ibuprofen (ADVIL,MOTRIN) 400 MG tablet Take 400 mg by mouth every 6 (six) hours as needed. For pain      . omeprazole (PRILOSEC) 20 MG capsule Take 20 mg by mouth daily.        Results for orders placed during the hospital encounter of 06/06/11 (from the past 48 hour(s))  COMPREHENSIVE METABOLIC PANEL     Status: Abnormal   Collection Time   06/06/11 11:59 AM      Component Value Range Comment   Sodium 131 (*) 135 - 145 (mEq/L)    Potassium 3.4 (*) 3.5 -  5.1 (mEq/L)    Chloride 92 (*) 96 - 112 (mEq/L)    CO2 28  19 - 32 (mEq/L)    Glucose, Bld 136 (*) 70 - 99 (mg/dL)    BUN 9  6 - 23 (mg/dL)    Creatinine, Ser 8.29  0.50 - 1.35 (mg/dL)    Calcium 56.2  8.4 - 10.5 (mg/dL)    Total Protein 7.5  6.0 - 8.3 (g/dL)    Albumin 3.4 (*) 3.5 - 5.2 (g/dL)    AST 13  0 - 37 (U/L)    ALT 21  0 - 53 (U/L)    Alkaline Phosphatase 90  39 - 117 (U/L)    Total Bilirubin 1.2  0.3 - 1.2 (mg/dL)    GFR calc non Af Amer >90  >90 (mL/min)    GFR calc Af Amer >90  >90 (mL/min)   CBC     Status: Abnormal   Collection Time   06/06/11 11:59 AM      Component Value Range Comment   WBC 18.9 (*) 4.0 - 10.5 (K/uL)    RBC 4.80  4.22 - 5.81 (MIL/uL)    Hemoglobin 14.1  13.0 - 17.0 (g/dL)    HCT 13.0  86.5 - 78.4 (%)    MCV 83.8  78.0 - 100.0 (fL)    MCH 29.4  26.0 - 34.0 (pg)    MCHC 35.1  30.0 - 36.0 (g/dL)    RDW  69.6  29.5 - 28.4 (%)    Platelets 293  150 - 400 (K/uL)   DIFFERENTIAL     Status: Abnormal   Collection Time   06/06/11 11:59 AM      Component Value Range Comment   Neutrophils Relative 82 (*) 43 - 77 (%)    Neutro Abs 15.4 (*) 1.7 - 7.7 (K/uL)    Lymphocytes Relative 6 (*) 12 - 46 (%)    Lymphs Abs 1.1  0.7 - 4.0 (K/uL)    Monocytes Relative 12  3 - 12 (%)    Monocytes Absolute 2.2 (*) 0.1 - 1.0 (K/uL)    Eosinophils Relative 1  0 - 5 (%)    Eosinophils Absolute 0.2  0.0 - 0.7 (K/uL)    Basophils Relative 0  0 - 1 (%)    Basophils Absolute 0.0  0.0 - 0.1 (K/uL)   LIPASE, BLOOD     Status: Normal   Collection Time   06/06/11 11:59 AM      Component Value Range Comment   Lipase 16  11 - 59 (U/L)   TROPONIN I     Status: Normal   Collection Time   06/06/11 11:59 AM      Component Value Range Comment   Troponin I <0.30  <0.30 (ng/mL)   D-DIMER, QUANTITATIVE     Status: Abnormal   Collection Time   06/06/11 11:59 AM      Component Value Range Comment   D-Dimer, Quant 1.42 (*) 0.00 - 0.48 (ug/mL-FEU)    Ct Angio Chest W/cm &/or Wo Cm  06/06/2011  *RADIOLOGY REPORT*  Clinical Data:  Severe pain between shoulder blades, radiating to epigastric area. Evaluate for dissection  CTA CHEST, ABDOMEN AND PELVIS WITH CONTRAST  Technique:  Multidetector CT imaging of the chest, abdomen and pelvis was performed following the standard protocol during bolus administration of intravenous contrast.  Contrast:  100 ml Omnipaque 350  Comparison:  None  CT CHEST  Findings:  The thoracic aorta is of normal  caliber.  There is minimal obscuration of the ascending aorta secondary to pulsation artifact. No evidence of thoracic aortic dissection.  There is no stranding about the thoracic aorta.  Review of the noncontrast images are negative for intramural hematoma.  Normal configuration of the aortic arch.  Visualized portions of the cervical vasculature are widely patent.  Normal heart size.  No pericardial effusion.   Although this examination is not tailored for evaluation of the pulmonary arteries, there is no discrete filling defect within the main, right or left pulmonary arteries to suggest acute pulmonary embolism. Evaluation of main pulmonary artery limited secondary to pulsation artifact, however appears minimally enlarged, measuring approximately 3.3 cm in greatest transverse axial dimension.  There is minimal bibasilar dependent ground-glass opacities, right greater than left favored to represent subsegmental atelectasis. No focal airspace opacities.  Central airways are patent.  No pneumothorax or pleural effusion.  There is minimal pleural thickening about the bilateral major fissures.  No mediastinal, hilar or axillary lymphadenopathy.  No acute aggressive osseous abnormalities within the chest.  IMPRESSION: No acute findings within the chest.  Specifically, no evidence of thoracic aortic dissection.  No focal airspace opacities.  2. Apparent minimal enlargement of the main pulmonary artery, possibly accentuated due to pulsation artifact, nonspecific but may be seen in the setting of pulmonary arterial hypertension.  Further evaluation with cardiac echo may be obtained as clinically indicated.  CT ABDOMEN AND PELVIS  Findings:  The evaluation of the abdominal organs is limited to the arterial phase of enhancement.  Normal hepatic contour.  The gallbladder is enlarged with pericholecystic stranding and hyperemia within the adjacent gallbladder fossa.  The radiopaque gallstone is not definitively identified in this examination.  No definite evidence of intra or extrahepatic biliary ductal dilatation.  No ascites.  There is symmetric enhancement of the bilateral kidneys.  Renal cysts are identified bilaterally, dominant hypoattenuating (6 HU) 4.4 cm cyst is identified within the mid aspect of the right kidney (image 136).  Right-sided pararenal peripelvic cyst.  No worrisome renal lesions.  No evidence of urinary  obstruction.  No perinephric stranding.  Bilateral adrenal glands, pancreas and spleen are normal.  Inflammatory changes in the gallbladder fossa extends adjacent to the hepatic flexure of the colon.  The bowel is otherwise normal in course and caliber without wall thickening or evidence of obstruction. The appendix is not definitely identified, however there is no inflammatory change in the right lower abdominal quadrant.  There is a minimal mesenteric fat containing periumbilical hernia.  No pneumoperitoneum, pneumatosis or portal venous gas.  Scattered atherosclerotic calcifications within a normal caliber thoracic aorta.  The major branch vessels of the abdominal aorta, including the IMA, are patent.  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  No retroperitoneal, mesenteric, pelvic or inguinal lymphadenopathy. Pelvic organs are normal.  No free fluid in the pelvis.  No acute or aggressive osseous abnormalities within the abdomen or pelvis.  L5 - S1 degenerative change.  IMPRESSION: 1.  Findings worrisome for acute cholecystitis.  Further evaluation may be obtained with either an abdominal ultrasound or HIDA scan as clinically indicated. 2.  Normal caliber of the abdominal aorta.  3.  Bilateral renal cysts.  Original Report Authenticated By: Waynard Reeds, M.D.   US Abdomen Complete  06/06/2011  *RADIOLOGY REPORT*  Clinical Data:  Abdominal pain.  COMPLETE ABDOMINAL ULTRASOUND  Comparison:  CT scan 06/05/2010.  Findings:  Gallbladder:  There are numerous echogenic gallstones in the  gallbladder along with tumefactive sludge, wall thickening and pericholecystic fluid.  Positive sonographic Murphy's sign.  Common bile duct:  Borderline dilated in caliber measuring a maximum of 6.54mm.  Liver:  Diffuse increased echogenicity consistent with fatty infiltration and also noted on the CT scan.  There are areas of focal fatty sparing.  No focal hepatic mass lesions or intrahepatic biliary  dilatation.  IVC:  Normal caliber.  Pancreas:  Sonographically normal.  Spleen:  Normal size and echogenicity.  Tiny scattered calcifications are noted.  Right Kidney:  13.8 cm in length.  Two large cysts are noted. Normal renal cortical thickness and echogenicity.  No hydronephrosis.  Left Kidney:  14.2 cm in length.  1.5 cm cyst.  Normal renal cortical thickness and echogenicity.  No hydronephrosis.  Abdominal aorta:  Normal caliber.  IMPRESSION:  1.  Sonographic findings of acute calculus cholecystitis. 2.  Borderline common bile duct at 6.5 mm. 3.  Diffuse fatty infiltration of the liver with focal fatty sparing. 4.  Bilateral renal cysts.  Original Report Authenticated By: P. Loralie Champagne, M.D.   Dg Abd Acute W/chest  06/06/2011  *RADIOLOGY REPORT*  Clinical Data: Chest/abdominal pain  ACUTE ABDOMEN SERIES (ABDOMEN 2 VIEW & CHEST 1 VIEW)  Comparison: None.  Findings: Low lung volumes.  Bibasilar atelectasis. No pleural effusion or pneumothorax.  The heart is within normal limits.  Mildly dilated loops of small bowel in the mid abdomen, measuring up to 4.2 cm.  Moderate stool in the right colon.  Mild degenerative changes of the visualized thoracolumbar spine.  IMPRESSION: Mildly dilated loops of small bowel in the mid abdomen, raising the possibility of early/partial small bowel obstruction.  Low lung volumes with bibasilar atelectasis.  Original Report Authenticated By: Charline Bills, M.D.   Ct Angio Abd/pel W/ And/or W/o  06/06/2011  *RADIOLOGY REPORT*  Clinical Data:  Severe pain between shoulder blades, radiating to epigastric area. Evaluate for dissection  CTA CHEST, ABDOMEN AND PELVIS WITH CONTRAST  Technique:  Multidetector CT imaging of the chest, abdomen and pelvis was performed following the standard protocol during bolus administration of intravenous contrast.  Contrast:  100 ml Omnipaque 350  Comparison:  None  CT CHEST  Findings:  The thoracic aorta is of normal caliber.  There is minimal  obscuration of the ascending aorta secondary to pulsation artifact. No evidence of thoracic aortic dissection.  There is no stranding about the thoracic aorta.  Review of the noncontrast images are negative for intramural hematoma.  Normal configuration of the aortic arch.  Visualized portions of the cervical vasculature are widely patent.  Normal heart size.  No pericardial effusion.  Although this examination is not tailored for evaluation of the pulmonary arteries, there is no discrete filling defect within the main, right or left pulmonary arteries to suggest acute pulmonary embolism. Evaluation of main pulmonary artery limited secondary to pulsation artifact, however appears minimally enlarged, measuring approximately 3.3 cm in greatest transverse axial dimension.  There is minimal bibasilar dependent ground-glass opacities, right greater than left favored to represent subsegmental atelectasis. No focal airspace opacities.  Central airways are patent.  No pneumothorax or pleural effusion.  There is minimal pleural thickening about the bilateral major fissures.  No mediastinal, hilar or axillary lymphadenopathy.  No acute aggressive osseous abnormalities within the chest.  IMPRESSION: No acute findings within the chest.  Specifically, no evidence of thoracic aortic dissection.  No focal airspace opacities.  2. Apparent minimal enlargement of the main pulmonary artery, possibly accentuated due  to pulsation artifact, nonspecific but may be seen in the setting of pulmonary arterial hypertension.  Further evaluation with cardiac echo may be obtained as clinically indicated.  CT ABDOMEN AND PELVIS  Findings:  The evaluation of the abdominal organs is limited to the arterial phase of enhancement.  Normal hepatic contour.  The gallbladder is enlarged with pericholecystic stranding and hyperemia within the adjacent gallbladder fossa.  The radiopaque gallstone is not definitively identified in this examination.  No  definite evidence of intra or extrahepatic biliary ductal dilatation.  No ascites.  There is symmetric enhancement of the bilateral kidneys.  Renal cysts are identified bilaterally, dominant hypoattenuating (6 HU) 4.4 cm cyst is identified within the mid aspect of the right kidney (image 136).  Right-sided pararenal peripelvic cyst.  No worrisome renal lesions.  No evidence of urinary obstruction.  No perinephric stranding.  Bilateral adrenal glands, pancreas and spleen are normal.  Inflammatory changes in the gallbladder fossa extends adjacent to the hepatic flexure of the colon.  The bowel is otherwise normal in course and caliber without wall thickening or evidence of obstruction. The appendix is not definitely identified, however there is no inflammatory change in the right lower abdominal quadrant.  There is a minimal mesenteric fat containing periumbilical hernia.  No pneumoperitoneum, pneumatosis or portal venous gas.  Scattered atherosclerotic calcifications within a normal caliber thoracic aorta.  The major branch vessels of the abdominal aorta, including the IMA, are patent.  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  No retroperitoneal, mesenteric, pelvic or inguinal lymphadenopathy. Pelvic organs are normal.  No free fluid in the pelvis.  No acute or aggressive osseous abnormalities within the abdomen or pelvis.  L5 - S1 degenerative change.  IMPRESSION: 1.  Findings worrisome for acute cholecystitis.  Further evaluation may be obtained with either an abdominal ultrasound or HIDA scan as clinically indicated. 2.  Normal caliber of the abdominal aorta.  3.  Bilateral renal cysts.  Original Report Authenticated By: Waynard Reeds, M.D.    Review of Systems  Constitutional: Negative.   HENT: Negative.   Eyes: Negative.   Respiratory: Negative.   Cardiovascular: Negative.   Gastrointestinal: Positive for heartburn, nausea, vomiting, abdominal pain and constipation.    Genitourinary: Negative.   Skin: Negative.   Neurological: Negative.   Endo/Heme/Allergies: Negative.   Psychiatric/Behavioral: Negative.     Blood pressure 138/87, pulse 73, temperature 97.3 F (36.3 C), temperature source Oral, resp. rate 18, SpO2 98.00%. Physical Exam  Constitutional: He is oriented to person, place, and time. He appears well-developed and well-nourished. He is active.  HENT:  Head: Normocephalic and atraumatic.  Eyes: Conjunctivae and EOM are normal. Pupils are equal, round, and reactive to light.  Neck: Normal range of motion. Neck supple.  Cardiovascular: Normal rate, regular rhythm and normal heart sounds.   Respiratory: Effort normal and breath sounds normal.  GI: Soft. Bowel sounds are normal. He exhibits no mass. There is tenderness (epigastric and RUQ tenderness). There is no rebound and no guarding.  Genitourinary: Penis normal.  Musculoskeletal: Normal range of motion.  Neurological: He is alert and oriented to person, place, and time. He has normal reflexes.  Skin: Skin is warm and dry.  Psychiatric: He has a normal mood and affect. His behavior is normal. Judgment and thought content normal.     Assessment/Plan Acute cholecystitis Cholelithiasis Morbid obesity Previous umbilical hernia repair with mesh.  Admit for IV antibiotics (Unasyn) Iv analgesia IV hydration.  Shahiem Bedwell III,Bryony Kaman O 06/06/2011, 5:32 PM

## 2011-06-06 NOTE — ED Notes (Signed)
Pt complians of pain in shoulder blades to epigastric area, sts mulitple symptoms onset since Thursday, [facility] appt at [facility] am and did not keep it due to wait so he came here for severe pain, nad noted, abc intact ,resp e/u sts hx of back pain in same area in past. Hx of arthritis in neck.

## 2011-06-07 ENCOUNTER — Encounter (HOSPITAL_COMMUNITY): Admission: EM | Disposition: A | Discharge status: Home / Self Care | Payer: Self-pay | Source: Home / Self Care

## 2011-06-07 ENCOUNTER — Encounter (HOSPITAL_COMMUNITY): Payer: Self-pay

## 2011-06-07 ENCOUNTER — Other Ambulatory Visit (INDEPENDENT_AMBULATORY_CARE_PROVIDER_SITE_OTHER): Payer: Self-pay | Admitting: General Surgery

## 2011-06-07 ENCOUNTER — Encounter (HOSPITAL_COMMUNITY): Payer: Self-pay | Admitting: General Practice

## 2011-06-07 ENCOUNTER — Inpatient Hospital Stay (HOSPITAL_COMMUNITY): Payer: Federal, State, Local not specified - PPO

## 2011-06-07 DIAGNOSIS — K812 Acute cholecystitis with chronic cholecystitis: Secondary | ICD-10-CM

## 2011-06-07 HISTORY — PX: CHOLECYSTECTOMY: SHX55

## 2011-06-07 LAB — COMPREHENSIVE METABOLIC PANEL
ALT: 25 U/L (ref 0–53)
Alkaline Phosphatase: 113 U/L (ref 39–117)
BUN: 11 mg/dL (ref 6–23)
Chloride: 94 mEq/L — ABNORMAL LOW (ref 96–112)
GFR calc Af Amer: >90 mL/min (ref >90)
Glucose, Bld: 136 mg/dL — ABNORMAL HIGH (ref 70–99)
Potassium: 3.5 mEq/L (ref 3.5–5.1)
Sodium: 131 mEq/L — ABNORMAL LOW (ref 135–145)
Total Bilirubin: 1.9 mg/dL — ABNORMAL HIGH (ref 0.3–1.2)
Total Protein: 7.1 g/dL (ref 6.0–8.3)

## 2011-06-07 LAB — CBC
HCT: 38.8 % — ABNORMAL LOW (ref 39.0–52.0)
Hemoglobin: 13.8 g/dL (ref 13.0–17.0)
MCHC: 35.6 g/dL (ref 30.0–36.0)
RBC: 4.65 MIL/uL (ref 4.22–5.81)
WBC: 22.8 10*3/uL — ABNORMAL HIGH (ref 4.0–10.5)

## 2011-06-07 LAB — SURGICAL PCR SCREEN: Staphylococcus aureus: POSITIVE — AB

## 2011-06-07 SURGERY — LAPAROSCOPIC CHOLECYSTECTOMY
Anesthesia: General | Site: Abdomen | Wound class: Contaminated

## 2011-06-07 MED ORDER — NEOSTIGMINE METHYLSULFATE 1 MG/ML IJ SOLN
INTRAMUSCULAR | Status: DC | PRN
  Administered 2011-06-07: 3 mg via INTRAVENOUS

## 2011-06-07 MED ORDER — MEPERIDINE HCL 25 MG/ML IJ SOLN
6.2500 mg | INTRAMUSCULAR | Status: DC | PRN
Start: 2011-06-07 — End: 2011-06-09

## 2011-06-07 MED ORDER — 0.9 % SODIUM CHLORIDE (POUR BTL) OPTIME
TOPICAL | Status: DC | PRN
  Administered 2011-06-07: 1000 mL

## 2011-06-07 MED ORDER — ROCURONIUM BROMIDE 100 MG/10ML IV SOLN
INTRAVENOUS | Status: DC | PRN
  Administered 2011-06-07: 10 mg via INTRAVENOUS
  Administered 2011-06-07: 50 mg via INTRAVENOUS
  Administered 2011-06-07 (×2): 5 mg via INTRAVENOUS

## 2011-06-07 MED ORDER — SUCCINYLCHOLINE CHLORIDE 20 MG/ML IJ SOLN
INTRAMUSCULAR | Status: DC | PRN
  Administered 2011-06-07: 110 mg via INTRAVENOUS

## 2011-06-07 MED ORDER — BUPIVACAINE-EPINEPHRINE 0.25% -1:200000 IJ SOLN
INTRAMUSCULAR | Status: DC | PRN
  Administered 2011-06-07: 14 mL

## 2011-06-07 MED ORDER — FENTANYL CITRATE 0.05 MG/ML IJ SOLN
INTRAMUSCULAR | Status: DC | PRN
  Administered 2011-06-07 (×2): 100 ug via INTRAVENOUS
  Administered 2011-06-07: 150 ug via INTRAVENOUS

## 2011-06-07 MED ORDER — HEMOSTATIC AGENTS (NO CHARGE) OPTIME
TOPICAL | Status: DC | PRN
  Administered 2011-06-07: 1

## 2011-06-07 MED ORDER — GLYCOPYRROLATE 0.2 MG/ML IJ SOLN
INTRAMUSCULAR | Status: DC | PRN
  Administered 2011-06-07: .4 mg via INTRAVENOUS

## 2011-06-07 MED ORDER — ONDANSETRON HCL 4 MG/2ML IJ SOLN
INTRAMUSCULAR | Status: DC | PRN
  Administered 2011-06-07: 4 mg via INTRAVENOUS

## 2011-06-07 MED ORDER — HYDROMORPHONE HCL PF 1 MG/ML IJ SOLN: 0.2500 mg | INTRAMUSCULAR | Status: DC | PRN

## 2011-06-07 MED ORDER — MIDAZOLAM HCL 5 MG/5ML IJ SOLN
INTRAMUSCULAR | Status: DC | PRN
  Administered 2011-06-07: 2 mg via INTRAVENOUS

## 2011-06-07 MED ORDER — SODIUM CHLORIDE 0.9 % IR SOLN
Status: DC | PRN
  Administered 2011-06-07: 1000 mL
  Administered 2011-06-07: 1

## 2011-06-07 MED ORDER — OXYCODONE-ACETAMINOPHEN 5-325 MG PO TABS
1.0000 | ORAL_TABLET | ORAL | Status: DC | PRN
  Administered 2011-06-07 – 2011-06-09 (×6): 2 via ORAL
  Filled 2011-06-07 (×7): qty 2

## 2011-06-07 MED ORDER — LACTATED RINGERS IV SOLN
INTRAVENOUS | Status: DC | PRN
  Administered 2011-06-07 (×2): via INTRAVENOUS

## 2011-06-07 MED ORDER — PROMETHAZINE HCL 25 MG/ML IJ SOLN: 6.2500 mg | INTRAMUSCULAR | Status: DC | PRN

## 2011-06-07 MED ORDER — PROPOFOL 10 MG/ML IV EMUL
INTRAVENOUS | Status: DC | PRN
  Administered 2011-06-07: 200 mg via INTRAVENOUS

## 2011-06-07 SURGICAL SUPPLY — 59 items
APPLIER CLIP 5 13 M/L LIGAMAX5 (MISCELLANEOUS) ×3
APPLIER CLIP ROT 10 11.4 M/L (STAPLE)
BLADE SURG ROTATE 9660 (MISCELLANEOUS) ×3 IMPLANT
CANISTER SUCTION 2500CC (MISCELLANEOUS) ×3 IMPLANT
CHLORAPREP W/TINT 26ML (MISCELLANEOUS) ×3 IMPLANT
CLIP APPLIE 5 13 M/L LIGAMAX5 (MISCELLANEOUS) ×2 IMPLANT
CLIP APPLIE ROT 10 11.4 M/L (STAPLE) IMPLANT
CLOTH BEACON ORANGE TIMEOUT ST (SAFETY) ×3 IMPLANT
COVER MAYO STAND STRL (DRAPES) ×3 IMPLANT
COVER SURGICAL LIGHT HANDLE (MISCELLANEOUS) ×3 IMPLANT
DECANTER SPIKE VIAL GLASS SM (MISCELLANEOUS) ×6 IMPLANT
DERMABOND ADVANCED (GAUZE/BANDAGES/DRESSINGS)
DERMABOND ADVANCED .7 DNX12 (GAUZE/BANDAGES/DRESSINGS) IMPLANT
DRAIN CHANNEL 19F RND (DRAIN) ×3 IMPLANT
DRAPE C-ARM 42X72 X-RAY (DRAPES) ×3 IMPLANT
DRAPE UTILITY 15X26 W/TAPE STR (DRAPE) ×6 IMPLANT
ELECT REM PT RETURN 9FT ADLT (ELECTROSURGICAL) ×3
ELECTRODE REM PT RTRN 9FT ADLT (ELECTROSURGICAL) ×2 IMPLANT
EVACUATOR SILICONE 100CC (DRAIN) ×3 IMPLANT
FILTER SMOKE EVAC LAPAROSHD (FILTER) ×3 IMPLANT
GAUZE SPONGE 4X4 16PLY XRAY LF (GAUZE/BANDAGES/DRESSINGS) ×3 IMPLANT
GLOVE BIO SURGEON STRL SZ7 (GLOVE) ×3 IMPLANT
GLOVE BIO SURGEON STRL SZ7.5 (GLOVE) ×3 IMPLANT
GLOVE BIO SURGEON STRL SZ8 (GLOVE) ×3 IMPLANT
GLOVE BIOGEL PI IND STRL 7.0 (GLOVE) ×2 IMPLANT
GLOVE BIOGEL PI IND STRL 7.5 (GLOVE) ×4 IMPLANT
GLOVE BIOGEL PI IND STRL 8 (GLOVE) ×4 IMPLANT
GLOVE BIOGEL PI INDICATOR 7.0 (GLOVE) ×1
GLOVE BIOGEL PI INDICATOR 7.5 (GLOVE) ×2
GLOVE BIOGEL PI INDICATOR 8 (GLOVE) ×2
GLOVE ECLIPSE 6.5 STRL STRAW (GLOVE) ×3 IMPLANT
GLOVE SS BIOGEL STRL SZ 8 (GLOVE) ×2 IMPLANT
GLOVE SUPERSENSE BIOGEL SZ 8 (GLOVE) ×1
GLOVE SURG SS PI 6.5 STRL IVOR (GLOVE) ×3 IMPLANT
GOWN PREVENTION PLUS XLARGE (GOWN DISPOSABLE) ×6 IMPLANT
GOWN STRL NON-REIN LRG LVL3 (GOWN DISPOSABLE) ×9 IMPLANT
HEMOSTAT SNOW SURGICEL 2X4 (HEMOSTASIS) ×3 IMPLANT
KIT BASIN OR (CUSTOM PROCEDURE TRAY) ×3 IMPLANT
KIT ROOM TURNOVER OR (KITS) ×3 IMPLANT
NS IRRIG 1000ML POUR BTL (IV SOLUTION) ×3 IMPLANT
PAD ARMBOARD 7.5X6 YLW CONV (MISCELLANEOUS) ×6 IMPLANT
POUCH SPECIMEN RETRIEVAL 10MM (ENDOMECHANICALS) ×3 IMPLANT
SCISSORS LAP 5X35 DISP (ENDOMECHANICALS) IMPLANT
SET CHOLANGIOGRAPH 5 50 .035 (SET/KITS/TRAYS/PACK) ×3 IMPLANT
SET IRRIG TUBING LAPAROSCOPIC (IRRIGATION / IRRIGATOR) ×3 IMPLANT
SLEEVE Z-THREAD 5X100MM (TROCAR) ×6 IMPLANT
SPECIMEN JAR SMALL (MISCELLANEOUS) ×3 IMPLANT
SPONGE GAUZE 4X4 12PLY (GAUZE/BANDAGES/DRESSINGS) ×3 IMPLANT
SUT ETHILON 2 0 FS 18 (SUTURE) ×3 IMPLANT
SUT VIC AB 4-0 PS2 27 (SUTURE) ×3 IMPLANT
SUT VICRYL 0 UR6 27IN ABS (SUTURE) ×3 IMPLANT
TAPE CLOTH SURG 6X10 WHT LF (GAUZE/BANDAGES/DRESSINGS) ×3 IMPLANT
TOWEL OR 17X24 6PK STRL BLUE (TOWEL DISPOSABLE) ×3 IMPLANT
TOWEL OR 17X26 10 PK STRL BLUE (TOWEL DISPOSABLE) ×3 IMPLANT
TRAY LAPAROSCOPIC (CUSTOM PROCEDURE TRAY) ×3 IMPLANT
TROCAR HASSON GELL 12X100 (TROCAR) ×3 IMPLANT
TROCAR Z-THREAD FIOS 11X100 BL (TROCAR) IMPLANT
TROCAR Z-THREAD FIOS 5X100MM (TROCAR) ×3 IMPLANT
WATER STERILE IRR 1000ML POUR (IV SOLUTION) IMPLANT

## 2011-06-07 NOTE — Anesthesia Procedure Notes (Signed)
Procedure Name: Intubation Date/Time: 06/07/2011 11:41 AM Performed by: Fuller Canada Pre-anesthesia Checklist: Patient identified, Timeout performed, Emergency Drugs available, Suction available and Patient being monitored Patient Re-evaluated:Patient Re-evaluated prior to inductionOxygen Delivery Method: Circle System Utilized Preoxygenation: Pre-oxygenation with 100% oxygen Intubation Type: IV induction and Rapid sequence Laryngoscope Size: Mac and 4 Grade View: Grade II Tube type: Oral Tube size: 7.5 mm Number of attempts: 1 Airway Equipment and Method: stylet Placement Confirmation: ETT inserted through vocal cords under direct vision,  breath sounds checked- equal and bilateral and positive ETCO2 Secured at: 23 cm Tube secured with: Tape Dental Injury: Teeth and Oropharynx as per pre-operative assessment

## 2011-06-07 NOTE — Transfer of Care (Signed)
Immediate Anesthesia Transfer of Care Note  Patient: Raymond Cooley  Procedure(s) Performed:  LAPAROSCOPIC CHOLECYSTECTOMY  Patient Location: PACU  Anesthesia Type: General  Level of Consciousness: awake, alert , oriented and patient cooperative  Airway & Oxygen Therapy: Patient Spontanous Breathing and Patient connected to nasal cannula oxygen  Post-op Assessment: Report given to PACU RN, Post -op Vital signs reviewed and stable and Patient moving all extremities  Post vital signs: Reviewed and stable  Complications: No apparent anesthesia complications

## 2011-06-07 NOTE — Anesthesia Preprocedure Evaluation (Addendum)
Anesthesia Evaluation  Patient identified by MRN, date of birth, ID band Patient awake    Reviewed: Allergy & Precautions, H&P , NPO status , Patient's Chart, lab work & pertinent test results  History of Anesthesia Complications Negative for: history of anesthetic complications  Airway Mallampati: II  Neck ROM: Full    Dental  (+) Teeth Intact   Pulmonary shortness of breath, with exertion and lying,    + decreased breath sounds      Cardiovascular hypertension, Regular Normal    Neuro/Psych  Neuromuscular disease    GI/Hepatic GERD-  ,  Endo/Other  Morbid obesity  Renal/GU      Musculoskeletal   Abdominal (+) obese,   Peds  Hematology   Anesthesia Other Findings   Reproductive/Obstetrics                          Anesthesia Physical Anesthesia Plan  ASA: II  Anesthesia Plan: General   Post-op Pain Management:    Induction: Intravenous  Airway Management Planned: Oral ETT  Additional Equipment:   Intra-op Plan:   Post-operative Plan: Extubation in OR  Informed Consent: I have reviewed the patients History and Physical, chart, labs and discussed the procedure including the risks, benefits and alternatives for the proposed anesthesia with the patient or authorized representative who has indicated his/her understanding and acceptance.   Dental advisory given  Plan Discussed with: CRNA and Surgeon  Anesthesia Plan Comments:         Anesthesia Quick Evaluation

## 2011-06-07 NOTE — Progress Notes (Signed)
Subjective: RUQ pain has eased a bit  Objective: Vital signs in last 24 hours: Temp:  [97.3 F (36.3 C)-98.7 F (37.1 C)] 98.7 F (37.1 C) (02/05 0531) Pulse Rate:  [72-90] 88  (02/05 0531) Resp:  [18-22] 20  (02/05 0531) BP: (129-152)/(61-87) 129/63 mmHg (02/05 0531) SpO2:  [95 %-100 %] 96 % (02/05 0531) Weight:  [135.353 kg (298 lb 6.4 oz)] 135.353 kg (298 lb 6.4 oz) (02/04 2102) Last BM Date: 06/06/11  Intake/Output from previous day: 02/04 0701 - 02/05 0700 In: 706 [I.V.:706] Out: 400 [Urine:400] Intake/Output this shift:    General appearance: alert and cooperative Resp: clear to auscultation bilaterally Cardio: regular rate and rhythm GI: soft, tender RUQ without guarding  Lab Results:   Basename 06/07/11 0620 06/06/11 1159  WBC 22.8* 18.9*  HGB 13.8 14.1  HCT 38.8* 40.2  PLT 313 293   BMET  Basename 06/07/11 0620 06/06/11 1159  NA 131* 131*  K 3.5 3.4*  CL 94* 92*  CO2 24 28  GLUCOSE 136* 136*  BUN 11 9  CREATININE 0.76 0.76  CALCIUM 9.6 10.1   PT/INR No results found for this basename: LABPROT:2,INR:2 in the last 72 hours ABG No results found for this basename: PHART:2,PCO2:2,PO2:2,HCO3:2 in the last 72 hours  Studies/Results: Ct Angio Chest W/cm &/or Wo Cm  06/06/2011  *RADIOLOGY REPORT*  Clinical Data:  Severe pain between shoulder blades, radiating to epigastric area. Evaluate for dissection  CTA CHEST, ABDOMEN AND PELVIS WITH CONTRAST  Technique:  Multidetector CT imaging of the chest, abdomen and pelvis was performed following the standard protocol during bolus administration of intravenous contrast.  Contrast:  100 ml Omnipaque 350  Comparison:  None  CT CHEST  Findings:  The thoracic aorta is of normal caliber.  There is minimal obscuration of the ascending aorta secondary to pulsation artifact. No evidence of thoracic aortic dissection.  There is no stranding about the thoracic aorta.  Review of the noncontrast images are negative for  intramural hematoma.  Normal configuration of the aortic arch.  Visualized portions of the cervical vasculature are widely patent.  Normal heart size.  No pericardial effusion.  Although this examination is not tailored for evaluation of the pulmonary arteries, there is no discrete filling defect within the main, right or left pulmonary arteries to suggest acute pulmonary embolism. Evaluation of main pulmonary artery limited secondary to pulsation artifact, however appears minimally enlarged, measuring approximately 3.3 cm in greatest transverse axial dimension.  There is minimal bibasilar dependent ground-glass opacities, right greater than left favored to represent subsegmental atelectasis. No focal airspace opacities.  Central airways are patent.  No pneumothorax or pleural effusion.  There is minimal pleural thickening about the bilateral major fissures.  No mediastinal, hilar or axillary lymphadenopathy.  No acute aggressive osseous abnormalities within the chest.  IMPRESSION: No acute findings within the chest.  Specifically, no evidence of thoracic aortic dissection.  No focal airspace opacities.  2. Apparent minimal enlargement of the main pulmonary artery, possibly accentuated due to pulsation artifact, nonspecific but may be seen in the setting of pulmonary arterial hypertension.  Further evaluation with cardiac echo may be obtained as clinically indicated.  CT ABDOMEN AND PELVIS  Findings:  The evaluation of the abdominal organs is limited to the arterial phase of enhancement.  Normal hepatic contour.  The gallbladder is enlarged with pericholecystic stranding and hyperemia within the adjacent gallbladder fossa.  The radiopaque gallstone is not definitively identified in this examination.  No definite evidence  of intra or extrahepatic biliary ductal dilatation.  No ascites.  There is symmetric enhancement of the bilateral kidneys.  Renal cysts are identified bilaterally, dominant hypoattenuating (6 HU)  4.4 cm cyst is identified within the mid aspect of the right kidney (image 136).  Right-sided pararenal peripelvic cyst.  No worrisome renal lesions.  No evidence of urinary obstruction.  No perinephric stranding.  Bilateral adrenal glands, pancreas and spleen are normal.  Inflammatory changes in the gallbladder fossa extends adjacent to the hepatic flexure of the colon.  The bowel is otherwise normal in course and caliber without wall thickening or evidence of obstruction. The appendix is not definitely identified, however there is no inflammatory change in the right lower abdominal quadrant.  There is a minimal mesenteric fat containing periumbilical hernia.  No pneumoperitoneum, pneumatosis or portal venous gas.  Scattered atherosclerotic calcifications within a normal caliber thoracic aorta.  The major branch vessels of the abdominal aorta, including the IMA, are patent.  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  No retroperitoneal, mesenteric, pelvic or inguinal lymphadenopathy. Pelvic organs are normal.  No free fluid in the pelvis.  No acute or aggressive osseous abnormalities within the abdomen or pelvis.  L5 - S1 degenerative change.  IMPRESSION: 1.  Findings worrisome for acute cholecystitis.  Further evaluation may be obtained with either an abdominal ultrasound or HIDA scan as clinically indicated. 2.  Normal caliber of the abdominal aorta.  3.  Bilateral renal cysts.  Original Report Authenticated By: Waynard Reeds, M.D.   US Abdomen Complete  06/06/2011  *RADIOLOGY REPORT*  Clinical Data:  Abdominal pain.  COMPLETE ABDOMINAL ULTRASOUND  Comparison:  CT scan 06/05/2010.  Findings:  Gallbladder:  There are numerous echogenic gallstones in the gallbladder along with tumefactive sludge, wall thickening and pericholecystic fluid.  Positive sonographic Murphy's sign.  Common bile duct:  Borderline dilated in caliber measuring a maximum of 6.76mm.  Liver:  Diffuse increased  echogenicity consistent with fatty infiltration and also noted on the CT scan.  There are areas of focal fatty sparing.  No focal hepatic mass lesions or intrahepatic biliary dilatation.  IVC:  Normal caliber.  Pancreas:  Sonographically normal.  Spleen:  Normal size and echogenicity.  Tiny scattered calcifications are noted.  Right Kidney:  13.8 cm in length.  Two large cysts are noted. Normal renal cortical thickness and echogenicity.  No hydronephrosis.  Left Kidney:  14.2 cm in length.  1.5 cm cyst.  Normal renal cortical thickness and echogenicity.  No hydronephrosis.  Abdominal aorta:  Normal caliber.  IMPRESSION:  1.  Sonographic findings of acute calculus cholecystitis. 2.  Borderline common bile duct at 6.5 mm. 3.  Diffuse fatty infiltration of the liver with focal fatty sparing. 4.  Bilateral renal cysts.  Original Report Authenticated By: P. Loralie Champagne, M.D.   Dg Abd Acute W/chest  06/06/2011  *RADIOLOGY REPORT*  Clinical Data: Chest/abdominal pain  ACUTE ABDOMEN SERIES (ABDOMEN 2 VIEW & CHEST 1 VIEW)  Comparison: None.  Findings: Low lung volumes.  Bibasilar atelectasis. No pleural effusion or pneumothorax.  The heart is within normal limits.  Mildly dilated loops of small bowel in the mid abdomen, measuring up to 4.2 cm.  Moderate stool in the right colon.  Mild degenerative changes of the visualized thoracolumbar spine.  IMPRESSION: Mildly dilated loops of small bowel in the mid abdomen, raising the possibility of early/partial small bowel obstruction.  Low lung volumes with bibasilar atelectasis.  Original Report  Authenticated By: Charline Bills, M.D.   Ct Angio Abd/pel W/ And/or W/o  06/06/2011  *RADIOLOGY REPORT*  Clinical Data:  Severe pain between shoulder blades, radiating to epigastric area. Evaluate for dissection  CTA CHEST, ABDOMEN AND PELVIS WITH CONTRAST  Technique:  Multidetector CT imaging of the chest, abdomen and pelvis was performed following the standard protocol during  bolus administration of intravenous contrast.  Contrast:  100 ml Omnipaque 350  Comparison:  None  CT CHEST  Findings:  The thoracic aorta is of normal caliber.  There is minimal obscuration of the ascending aorta secondary to pulsation artifact. No evidence of thoracic aortic dissection.  There is no stranding about the thoracic aorta.  Review of the noncontrast images are negative for intramural hematoma.  Normal configuration of the aortic arch.  Visualized portions of the cervical vasculature are widely patent.  Normal heart size.  No pericardial effusion.  Although this examination is not tailored for evaluation of the pulmonary arteries, there is no discrete filling defect within the main, right or left pulmonary arteries to suggest acute pulmonary embolism. Evaluation of main pulmonary artery limited secondary to pulsation artifact, however appears minimally enlarged, measuring approximately 3.3 cm in greatest transverse axial dimension.  There is minimal bibasilar dependent ground-glass opacities, right greater than left favored to represent subsegmental atelectasis. No focal airspace opacities.  Central airways are patent.  No pneumothorax or pleural effusion.  There is minimal pleural thickening about the bilateral major fissures.  No mediastinal, hilar or axillary lymphadenopathy.  No acute aggressive osseous abnormalities within the chest.  IMPRESSION: No acute findings within the chest.  Specifically, no evidence of thoracic aortic dissection.  No focal airspace opacities.  2. Apparent minimal enlargement of the main pulmonary artery, possibly accentuated due to pulsation artifact, nonspecific but may be seen in the setting of pulmonary arterial hypertension.  Further evaluation with cardiac echo may be obtained as clinically indicated.  CT ABDOMEN AND PELVIS  Findings:  The evaluation of the abdominal organs is limited to the arterial phase of enhancement.  Normal hepatic contour.  The gallbladder is  enlarged with pericholecystic stranding and hyperemia within the adjacent gallbladder fossa.  The radiopaque gallstone is not definitively identified in this examination.  No definite evidence of intra or extrahepatic biliary ductal dilatation.  No ascites.  There is symmetric enhancement of the bilateral kidneys.  Renal cysts are identified bilaterally, dominant hypoattenuating (6 HU) 4.4 cm cyst is identified within the mid aspect of the right kidney (image 136).  Right-sided pararenal peripelvic cyst.  No worrisome renal lesions.  No evidence of urinary obstruction.  No perinephric stranding.  Bilateral adrenal glands, pancreas and spleen are normal.  Inflammatory changes in the gallbladder fossa extends adjacent to the hepatic flexure of the colon.  The bowel is otherwise normal in course and caliber without wall thickening or evidence of obstruction. The appendix is not definitely identified, however there is no inflammatory change in the right lower abdominal quadrant.  There is a minimal mesenteric fat containing periumbilical hernia.  No pneumoperitoneum, pneumatosis or portal venous gas.  Scattered atherosclerotic calcifications within a normal caliber thoracic aorta.  The major branch vessels of the abdominal aorta, including the IMA, are patent.  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  No retroperitoneal, mesenteric, pelvic or inguinal lymphadenopathy. Pelvic organs are normal.  No free fluid in the pelvis.  No acute or aggressive osseous abnormalities within the abdomen or pelvis.  L5 - S1 degenerative  change.  IMPRESSION: 1.  Findings worrisome for acute cholecystitis.  Further evaluation may be obtained with either an abdominal ultrasound or HIDA scan as clinically indicated. 2.  Normal caliber of the abdominal aorta.  3.  Bilateral renal cysts.  Original Report Authenticated By: Waynard Reeds, M.D.    Anti-infectives: Anti-infectives     Start     Dose/Rate Route  Frequency Ordered Stop   06/07/11 0000   Ampicillin-Sulbactam (UNASYN) 3 g in sodium chloride 0.9 % 100 mL IVPB        3 g 100 mL/hr over 60 Minutes Intravenous 4 times per day 06/06/11 2242     06/06/11 1645   Ampicillin-Sulbactam (UNASYN) 3 g in sodium chloride 0.9 % 100 mL IVPB        3 g 100 mL/hr over 60 Minutes Intravenous  Once 06/06/11 1641 06/06/11 1757          Assessment/Plan: Acute cholecystitis - continue Unasyn, plan OR for lap chole and cholangiogram today.I discussed the procedure in detail.  The patient was given Agricultural engineer.  We discussed the risks and benefits of a laparoscopic cholecystectomy and possible cholangiogram including, but not limited to bleeding, infection, injury to surrounding structures such as the intestine or liver, bile leak, retained gallstones, need to convert to an open procedure, prolonged diarrhea, blood clots such as  DVT, common bile duct injury, anesthesia risks, and possible need for additional procedures.  The likelihood of improvement in symptoms and return to the patient's normal status is good. We discussed the typical post-operative recovery course.    LOS: 1 day    Raymond Cooley 06/07/2011

## 2011-06-07 NOTE — Anesthesia Postprocedure Evaluation (Signed)
  Anesthesia Post-op Note  Patient: Raymond Cooley  Procedure(s) Performed:  LAPAROSCOPIC CHOLECYSTECTOMY  Patient Location: PACU  Anesthesia Type: General  Level of Consciousness: awake  Airway and Oxygen Therapy: Patient Spontanous Breathing  Post-op Pain: mild  Post-op Assessment: Post-op Vital signs reviewed  Post-op Vital Signs: stable  Complications: No apparent anesthesia complications

## 2011-06-07 NOTE — Op Note (Signed)
06/06/2011 - 06/07/2011  1:14 PM  PATIENT:  Raymond Cooley  51 y.o. male  PRE-OPERATIVE DIAGNOSIS:  cholecystitis  POST-OPERATIVE DIAGNOSIS:  Gangrenous cholecystitis  PROCEDURE:  Procedure(s): LAPAROSCOPIC CHOLECYSTECTOMY  SURGEON:  Surgeon(s): Liz Malady, MD  PHYSICIAN ASSISTANT: Brayton El, PAC  ASSISTANTS: none   ANESTHESIA:   general  EBL:  Total I/O In: 1400 [I.V.:1400] Out: -   BLOOD ADMINISTERED:none  DRAINS: (1) Jackson-Pratt drain(s) with closed bulb suction in the RUQ   SPECIMEN:  Excision  DISPOSITION OF SPECIMEN:  PATHOLOGY  COUNTS:  YES  DICTATION: Reubin Milan Dictation patient was admitted with acute cholecystitis. He is brought for urgent cholecystectomy. He is on antibiotic regimen intravenously. He was identified in the holding area. Informed consent had been obtained. He was brought to the operating room and general endotracheal anesthesia was administered by the anesthesia staff. His abdomen was prepped and draped in sterile fashion. We did time out procedure. Due to patient's previous umbilical hernia repair, we placed our initial trocar above the umbilicus. Local anesthetic was injected. Incision was made and subcutaneous tissues were dissected down to the fascia. This was divided along the midline. The peritoneal cavity was entered under direct vision. A 0 Vicryl pursestring suture was placed on the fascial opening. The [facility] trocar was inserted into the abdomen. The abdomen was insufflated with carbon dioxide. Laparoscopic exploration revealed dense adhesions up to the gallbladder. 5 mm epigastric and 25 mm right lateral ports were placed under direct vision. Local anesthetic was used at those sites as well. Next blunt dissection was used to expose the dome of the gallbladder. This was tensely distended. There was patchy necrosis. It was drained with a nahzat drainage system. This allowed [place] to grasp the dome and retracted superior medially. The  adhesive couple was then gradually swept down off gallbladder revealing the body and finally the infundibulum. Dissection began laterally and gradually progressed medially and inflammation. We identified the cystic duct however it was partially necrotic. Dissection continued until a good window was made between the cystic duct the liver and the infundibulum. Once we had a critical view further inspection the cystic duct revealed a viable base near the junction with the common bile duct. Most of the rest the cystic duct however was necrotic. Therefore we could not do a cholangiogram. 3 clips were placed proximally on the cystic duct. One clip was placed distally and it was divided. Further dissection revealed the cystic artery which was clipped twice proximally and once distally and divided. The gallbladder was gradually taken off the liver bed with Bovie cautery. It was densely adherent and the back wall was mostly necrotic. The gallbladder was placed in an Endo Catch bag and removed from the abdomen via the supraumbilical port site. Liver bed was copiously irrigated. Meticulous hemostasis was obtained. We then placed a piece of Surgicel snow. Due to the poor condition of the cystic duct, we placed a 19 [place] Blake drain in the liver bed. This was secured with 2-0 nylon suture. Was no further bleeding. Residual irrigation fluid was evacuated. There was no visible draining bile. Ports were removed and pneumoperitoneum was released. Wounds were copiously irrigated. The supraumbilical fascia was closed by tying or 0 Vicryl pressuring suture. We also placed an additional figure-of-eight 0 Vicryl suture to get a secure closure. Wounds were then closed with running 4-0 Vicryl subcuticular stitch followed by Dermabond. Sponge needle and instrument counts were all correct. There were no apparent complications and the patient was taken  recovery in stable condition.  PATIENT DISPOSITION:  PACU - hemodynamically stable.     Delay start of Pharmacological VTE agent (>24hrs) due to surgical blood loss or risk of bleeding:  no  Violeta Gelinas, MD, MPH, FACS Pager: [phone]  2/5/20131:14 PM

## 2011-06-08 ENCOUNTER — Encounter (HOSPITAL_COMMUNITY): Payer: Self-pay | Admitting: General Surgery

## 2011-06-08 NOTE — Progress Notes (Signed)
Patient ID: Raymond Cooley, male   DOB: Nov 22, 1960, 51 y.o.   MRN: [id number] 1 Day Post-Op  Subjective: Pt with some pain.  Controlled.  C/o sweating all night.  Tolerating clear liquids.  Objective: Vital signs in last 24 hours: Temp:  [98.4 F (36.9 C)-100 F (37.8 C)] 100 F (37.8 C) (02/06 0528) Pulse Rate:  [73-105] 89  (02/06 0528) Resp:  [18-24] 20  (02/06 0528) BP: (82-142)/(60-83) 114/82 mmHg (02/06 0528) SpO2:  [93 %-99 %] 93 % (02/06 0528) Last BM Date: 06/05/11  Intake/Output from previous day: 02/05 0701 - 02/06 0700 In: 2692 [P.O.:240; I.V.:2402; IV Piggyback:50] Out: 1000 [Urine:800; Drains:200] Intake/Output this shift:    PE: Abd: soft, tender in RUQ as expected. Few BS, ND, obese, incisions c/d/i with dermabond.  JP drain present with serosang output. Heart: regular Lungs: CTAB  Lab Results:   Basename 06/07/11 0620 06/06/11 1159  WBC 22.8* 18.9*  HGB 13.8 14.1  HCT 38.8* 40.2  PLT 313 293   BMET  Basename 06/07/11 0620 06/06/11 1159  NA 131* 131*  K 3.5 3.4*  CL 94* 92*  CO2 24 28  GLUCOSE 136* 136*  BUN 11 9  CREATININE 0.76 0.76  CALCIUM 9.6 10.1   PT/INR No results found for this basename: LABPROT:2,INR:2 in the last 72 hours   Studies/Results: Ct Angio Chest W/cm &/or Wo Cm  06/06/2011  *RADIOLOGY REPORT*  Clinical Data:  Severe pain between shoulder blades, radiating to epigastric area. Evaluate for dissection  CTA CHEST, ABDOMEN AND PELVIS WITH CONTRAST  Technique:  Multidetector CT imaging of the chest, abdomen and pelvis was performed following the standard protocol during bolus administration of intravenous contrast.  Contrast:  100 ml Omnipaque 350  Comparison:  None  CT CHEST  Findings:  The thoracic aorta is of normal caliber.  There is minimal obscuration of the ascending aorta secondary to pulsation artifact. No evidence of thoracic aortic dissection.  There is no stranding about the thoracic aorta.  Review of the noncontrast  images are negative for intramural hematoma.  Normal configuration of the aortic arch.  Visualized portions of the cervical vasculature are widely patent.  Normal heart size.  No pericardial effusion.  Although this examination is not tailored for evaluation of the pulmonary arteries, there is no discrete filling defect within the main, right or left pulmonary arteries to suggest acute pulmonary embolism. Evaluation of main pulmonary artery limited secondary to pulsation artifact, however appears minimally enlarged, measuring approximately 3.3 cm in greatest transverse axial dimension.  There is minimal bibasilar dependent ground-glass opacities, right greater than left favored to represent subsegmental atelectasis. No focal airspace opacities.  Central airways are patent.  No pneumothorax or pleural effusion.  There is minimal pleural thickening about the bilateral major fissures.  No mediastinal, hilar or axillary lymphadenopathy.  No acute aggressive osseous abnormalities within the chest.  IMPRESSION: No acute findings within the chest.  Specifically, no evidence of thoracic aortic dissection.  No focal airspace opacities.  2. Apparent minimal enlargement of the main pulmonary artery, possibly accentuated due to pulsation artifact, nonspecific but may be seen in the setting of pulmonary arterial hypertension.  Further evaluation with cardiac echo may be obtained as clinically indicated.  CT ABDOMEN AND PELVIS  Findings:  The evaluation of the abdominal organs is limited to the arterial phase of enhancement.  Normal hepatic contour.  The gallbladder is enlarged with pericholecystic stranding and hyperemia within the adjacent gallbladder fossa.  The radiopaque gallstone is  not definitively identified in this examination.  No definite evidence of intra or extrahepatic biliary ductal dilatation.  No ascites.  There is symmetric enhancement of the bilateral kidneys.  Renal cysts are identified bilaterally, dominant  hypoattenuating (6 HU) 4.4 cm cyst is identified within the mid aspect of the right kidney (image 136).  Right-sided pararenal peripelvic cyst.  No worrisome renal lesions.  No evidence of urinary obstruction.  No perinephric stranding.  Bilateral adrenal glands, pancreas and spleen are normal.  Inflammatory changes in the gallbladder fossa extends adjacent to the hepatic flexure of the colon.  The bowel is otherwise normal in course and caliber without wall thickening or evidence of obstruction. The appendix is not definitely identified, however there is no inflammatory change in the right lower abdominal quadrant.  There is a minimal mesenteric fat containing periumbilical hernia.  No pneumoperitoneum, pneumatosis or portal venous gas.  Scattered atherosclerotic calcifications within a normal caliber thoracic aorta.  The major branch vessels of the abdominal aorta, including the IMA, are patent.  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  No retroperitoneal, mesenteric, pelvic or inguinal lymphadenopathy. Pelvic organs are normal.  No free fluid in the pelvis.  No acute or aggressive osseous abnormalities within the abdomen or pelvis.  L5 - S1 degenerative change.  IMPRESSION: 1.  Findings worrisome for acute cholecystitis.  Further evaluation may be obtained with either an abdominal ultrasound or HIDA scan as clinically indicated. 2.  Normal caliber of the abdominal aorta.  3.  Bilateral renal cysts.  Original Report Authenticated By: Waynard Reeds, M.D.   US Abdomen Complete  06/06/2011  *RADIOLOGY REPORT*  Clinical Data:  Abdominal pain.  COMPLETE ABDOMINAL ULTRASOUND  Comparison:  CT scan 06/05/2010.  Findings:  Gallbladder:  There are numerous echogenic gallstones in the gallbladder along with tumefactive sludge, wall thickening and pericholecystic fluid.  Positive sonographic Murphy's sign.  Common bile duct:  Borderline dilated in caliber measuring a maximum of 6.65mm.  Liver:   Diffuse increased echogenicity consistent with fatty infiltration and also noted on the CT scan.  There are areas of focal fatty sparing.  No focal hepatic mass lesions or intrahepatic biliary dilatation.  IVC:  Normal caliber.  Pancreas:  Sonographically normal.  Spleen:  Normal size and echogenicity.  Tiny scattered calcifications are noted.  Right Kidney:  13.8 cm in length.  Two large cysts are noted. Normal renal cortical thickness and echogenicity.  No hydronephrosis.  Left Kidney:  14.2 cm in length.  1.5 cm cyst.  Normal renal cortical thickness and echogenicity.  No hydronephrosis.  Abdominal aorta:  Normal caliber.  IMPRESSION:  1.  Sonographic findings of acute calculus cholecystitis. 2.  Borderline common bile duct at 6.5 mm. 3.  Diffuse fatty infiltration of the liver with focal fatty sparing. 4.  Bilateral renal cysts.  Original Report Authenticated By: P. Loralie Champagne, M.D.   Dg Abd Acute W/chest  06/06/2011  *RADIOLOGY REPORT*  Clinical Data: Chest/abdominal pain  ACUTE ABDOMEN SERIES (ABDOMEN 2 VIEW & CHEST 1 VIEW)  Comparison: None.  Findings: Low lung volumes.  Bibasilar atelectasis. No pleural effusion or pneumothorax.  The heart is within normal limits.  Mildly dilated loops of small bowel in the mid abdomen, measuring up to 4.2 cm.  Moderate stool in the right colon.  Mild degenerative changes of the visualized thoracolumbar spine.  IMPRESSION: Mildly dilated loops of small bowel in the mid abdomen, raising the possibility of early/partial small bowel obstruction.  Low lung volumes with bibasilar atelectasis.  Original Report Authenticated By: Charline Bills, M.D.   Ct Angio Abd/pel W/ And/or W/o  06/06/2011  *RADIOLOGY REPORT*  Clinical Data:  Severe pain between shoulder blades, radiating to epigastric area. Evaluate for dissection  CTA CHEST, ABDOMEN AND PELVIS WITH CONTRAST  Technique:  Multidetector CT imaging of the chest, abdomen and pelvis was performed following the standard  protocol during bolus administration of intravenous contrast.  Contrast:  100 ml Omnipaque 350  Comparison:  None  CT CHEST  Findings:  The thoracic aorta is of normal caliber.  There is minimal obscuration of the ascending aorta secondary to pulsation artifact. No evidence of thoracic aortic dissection.  There is no stranding about the thoracic aorta.  Review of the noncontrast images are negative for intramural hematoma.  Normal configuration of the aortic arch.  Visualized portions of the cervical vasculature are widely patent.  Normal heart size.  No pericardial effusion.  Although this examination is not tailored for evaluation of the pulmonary arteries, there is no discrete filling defect within the main, right or left pulmonary arteries to suggest acute pulmonary embolism. Evaluation of main pulmonary artery limited secondary to pulsation artifact, however appears minimally enlarged, measuring approximately 3.3 cm in greatest transverse axial dimension.  There is minimal bibasilar dependent ground-glass opacities, right greater than left favored to represent subsegmental atelectasis. No focal airspace opacities.  Central airways are patent.  No pneumothorax or pleural effusion.  There is minimal pleural thickening about the bilateral major fissures.  No mediastinal, hilar or axillary lymphadenopathy.  No acute aggressive osseous abnormalities within the chest.  IMPRESSION: No acute findings within the chest.  Specifically, no evidence of thoracic aortic dissection.  No focal airspace opacities.  2. Apparent minimal enlargement of the main pulmonary artery, possibly accentuated due to pulsation artifact, nonspecific but may be seen in the setting of pulmonary arterial hypertension.  Further evaluation with cardiac echo may be obtained as clinically indicated.  CT ABDOMEN AND PELVIS  Findings:  The evaluation of the abdominal organs is limited to the arterial phase of enhancement.  Normal hepatic contour.  The  gallbladder is enlarged with pericholecystic stranding and hyperemia within the adjacent gallbladder fossa.  The radiopaque gallstone is not definitively identified in this examination.  No definite evidence of intra or extrahepatic biliary ductal dilatation.  No ascites.  There is symmetric enhancement of the bilateral kidneys.  Renal cysts are identified bilaterally, dominant hypoattenuating (6 HU) 4.4 cm cyst is identified within the mid aspect of the right kidney (image 136).  Right-sided pararenal peripelvic cyst.  No worrisome renal lesions.  No evidence of urinary obstruction.  No perinephric stranding.  Bilateral adrenal glands, pancreas and spleen are normal.  Inflammatory changes in the gallbladder fossa extends adjacent to the hepatic flexure of the colon.  The bowel is otherwise normal in course and caliber without wall thickening or evidence of obstruction. The appendix is not definitely identified, however there is no inflammatory change in the right lower abdominal quadrant.  There is a minimal mesenteric fat containing periumbilical hernia.  No pneumoperitoneum, pneumatosis or portal venous gas.  Scattered atherosclerotic calcifications within a normal caliber thoracic aorta.  The major branch vessels of the abdominal aorta, including the IMA, are patent.  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  No retroperitoneal, mesenteric, pelvic or inguinal lymphadenopathy. Pelvic organs are normal.  No free fluid in the pelvis.  No acute or aggressive osseous abnormalities within  the abdomen or pelvis.  L5 - S1 degenerative change.  IMPRESSION: 1.  Findings worrisome for acute cholecystitis.  Further evaluation may be obtained with either an abdominal ultrasound or HIDA scan as clinically indicated. 2.  Normal caliber of the abdominal aorta.  3.  Bilateral renal cysts.  Original Report Authenticated By: Waynard Reeds, M.D.    Anti-infectives: Anti-infectives     Start      Dose/Rate Route Frequency Ordered Stop   06/07/11 0000   Ampicillin-Sulbactam (UNASYN) 3 g in sodium chloride 0.9 % 100 mL IVPB        3 g 100 mL/hr over 60 Minutes Intravenous 4 times per day 06/06/11 2242     06/06/11 1645   Ampicillin-Sulbactam (UNASYN) 3 g in sodium chloride 0.9 % 100 mL IVPB        3 g 100 mL/hr over 60 Minutes Intravenous  Once 06/06/11 1641 06/06/11 1757           Assessment/Plan  1. S/p lap chole for gangrenous cholecystitis  Plan: 1. Will advance his diet as he tolerates today 2. Encourage mobilization 3. Only getting about 500 on IS, encourage pulm toileting as well 4. Cont ABX 5. Check labs in the am to make sure they are trending down.  TB was 1.9 yesterday and unable to get a cholangiogram.   LOS: 2 days    Caitrin Pendergraph E 06/08/2011

## 2011-06-08 NOTE — Progress Notes (Signed)
Walking in hall Patient examined and I agree with the assessment and plan  Violeta Gelinas, MD, MPH, FACS Pager: [phone]  06/08/2011 2:39 PM

## 2011-06-08 NOTE — Progress Notes (Signed)
UR complete 

## 2011-06-09 LAB — COMPREHENSIVE METABOLIC PANEL
ALT: 61 U/L — ABNORMAL HIGH (ref 0–53)
BUN: 12 mg/dL (ref 6–23)
Calcium: 8.7 mg/dL (ref 8.4–10.5)
Creatinine, Ser: 0.78 mg/dL (ref 0.50–1.35)
GFR calc Af Amer: >90 mL/min (ref >90)
GFR calc non Af Amer: >90 mL/min (ref >90)
Glucose, Bld: 149 mg/dL — ABNORMAL HIGH (ref 70–99)
Sodium: 130 mEq/L — ABNORMAL LOW (ref 135–145)
Total Protein: 5.9 g/dL — ABNORMAL LOW (ref 6.0–8.3)

## 2011-06-09 LAB — CBC
Hemoglobin: 11.4 g/dL — ABNORMAL LOW (ref 13.0–17.0)
MCH: 28.8 pg (ref 26.0–34.0)
MCHC: 34 g/dL (ref 30.0–36.0)
MCV: 84.6 fL (ref 78.0–100.0)

## 2011-06-09 MED ORDER — OXYCODONE-ACETAMINOPHEN 5-325 MG PO TABS: 1.0000 | ORAL_TABLET | ORAL | Status: AC | PRN

## 2011-06-09 NOTE — Discharge Summary (Signed)
Trevion Hoben, MD, MPH, FACS Pager: [phone]  

## 2011-06-09 NOTE — Discharge Summary (Signed)
Patient ID: Raymond Cooley MRN: [id number] DOB/AGE: [birth year] 51 y.o.  Admit date: 06/06/2011 Discharge date: 06/09/2011  Procedures:  Laparoscopic cholecystectomy with placement of 56F Blake drain  Consults: None  Reason for Admission:  The patient presented to [facility] with 5 days of severe back and RUQ abdominal pain.  He was worked up and found to have acute cholecystitis.  Admission Diagnoses: 1. Acute cholecystitis  Hospital Course:  The patient was admitted and started on IV ABX.  He was taken to the operating room where he was noted to have gangrenous cholecystitis.  A cholangiogram was unable to be obtained secondary to a necrotic cystic duct.  A JP drain was placed to observe for an post-op complications.  On POD#1 the patient was having some pain and tolerating clear liquids.  His drain was non-bilious.  His diet was advanced.  By POD# 2, he was tolerating a regular diet and his pain was better controlled with po pain meds.  His drain remained stable and he was discharged with this in place.  Discharge Diagnoses:  1. Acute gangrenous cholecystitis 2. S/p lap chole 3. Morbidly obese  Discharge Medications: Medication List  As of 06/09/2011 10:39 AM   STOP taking these medications         acetaminophen 500 MG tablet      HYDROcodone-acetaminophen 5-325 MG per tablet         TAKE these medications         docusate sodium 100 MG capsule   Commonly known as: COLACE   Take 200 mg by mouth 2 (two) times daily as needed. For constipation      gabapentin 400 MG capsule   Commonly known as: NEURONTIN   Take 400 mg by mouth 2 times daily at 12 noon and 4 pm.      ibuprofen 400 MG tablet   Commonly known as: ADVIL,MOTRIN   Take 400 mg by mouth every 6 (six) hours as needed. For pain      lisinopril-hydrochlorothiazide 10-12.5 MG per tablet   Commonly known as: PRINZIDE,ZESTORETIC   Take 1 tablet by mouth daily.      omeprazole 20 MG capsule   Commonly known as: PRILOSEC   Take 20 mg by mouth daily.      oxyCODONE-acetaminophen 5-325 MG per tablet   Commonly known as: PERCOCET   Take 1-2 tablets by mouth every 4 (four) hours as needed.      polyethylene glycol packet   Commonly known as: MIRALAX / GLYCOLAX   Take 17 g by mouth daily.            Discharge Instructions: Follow-up Information    Follow up with [facility] on 06/14/2011. (3:00pm, arrive at 2:40pm)    Contact information:   [facility] [address] [place] [phone]         Signed: Letha Cape 06/09/2011, 10:39 AM

## 2011-06-09 NOTE — Progress Notes (Signed)
Discharge instructions/Med Rec Sheet reviewed w/ pt. Pt expressed understanding and copies given w/ prescriptions. Pt ambulated off unit in stable condition, accompanied by his family, per his request.

## 2011-06-14 ENCOUNTER — Encounter (INDEPENDENT_AMBULATORY_CARE_PROVIDER_SITE_OTHER): Payer: Self-pay | Admitting: General Surgery

## 2011-06-14 ENCOUNTER — Ambulatory Visit (INDEPENDENT_AMBULATORY_CARE_PROVIDER_SITE_OTHER): Payer: Federal, State, Local not specified - PPO | Admitting: General Surgery

## 2011-06-14 VITALS — BP 144/96 | HR 74 | Temp 97.6°F | Resp 18 | Ht 72.0 in | Wt 303.0 lb

## 2011-06-14 DIAGNOSIS — Z9889 Other specified postprocedural states: Secondary | ICD-10-CM

## 2011-06-14 DIAGNOSIS — K810 Acute cholecystitis: Secondary | ICD-10-CM

## 2011-06-14 NOTE — Progress Notes (Signed)
  Subjective: Pt feels well.  Still some soreness.  Minimal output from drain.  Eating well  Objective: Abd: soft, tender, obese, +BS. JP drain with about 5 cc of serosang output.  JP drain removed without any difficulties.   Assessment/Plan  1. S/p lap chole 2. S/p JP drain removal  Plan: return to clinic in 2 weeks for a postoperative visit.  His restrictions still apply and we have discussed that.    La Shehan E 06/14/2011

## 2011-06-14 NOTE — Patient Instructions (Signed)
Return to clinic on 06-28-11 at 2:10pm for your next appoitnment

## 2011-06-28 ENCOUNTER — Encounter: Payer: Self-pay | Admitting: General Surgery

## 2011-06-28 ENCOUNTER — Ambulatory Visit (INDEPENDENT_AMBULATORY_CARE_PROVIDER_SITE_OTHER): Payer: Federal, State, Local not specified - PPO | Admitting: General Surgery

## 2011-06-28 ENCOUNTER — Encounter (INDEPENDENT_AMBULATORY_CARE_PROVIDER_SITE_OTHER): Payer: Self-pay

## 2011-06-28 VITALS — BP 120/80 | HR 78 | Ht 71.0 in | Wt 295.0 lb

## 2011-06-28 DIAGNOSIS — K812 Acute cholecystitis with chronic cholecystitis: Secondary | ICD-10-CM | POA: Insufficient documentation

## 2011-06-28 NOTE — Progress Notes (Signed)
Raymond Cooley 1960/06/12 [id number] 06/28/2011   Raymond Cooley is a 51 y.o. male who had a laparoscopic cholecystectomy with intraoperative cholangiogram.  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  The patient reports that they are feeling well with normal bowel movements and good appetite.  The pre-operative symptoms of abdominal pain, nausea, and vomiting have resolved.    Physical examination - Incisions appear well-healed with no sign of infection or bleeding.   Abdomen - soft, non-tender  Impression:  s/p laparoscopic cholecystectomy  Plan:  He may resume a regular diet and full activity.  He may follow-up on a PRN basis.

## 2011-06-28 NOTE — Patient Instructions (Signed)
Call if you have any problems.  No lifting over 20 pounds for 4 weeks after surgery.

## 2019-08-01 ENCOUNTER — Ambulatory Visit: Payer: Federal, State, Local not specified - PPO | Attending: Internal Medicine

## 2019-08-01 DIAGNOSIS — Z23 Encounter for immunization: Secondary | ICD-10-CM

## 2019-08-01 NOTE — Progress Notes (Signed)
   Covid-19 Vaccination Clinic  Name:  EASTEN MACEACHERN    MRN: [id number] DOB: 06/19/1960  08/01/2019  Mr. Staff was observed post Covid-19 immunization for 15 minutes without incident. He was provided with Vaccine Information Sheet and instruction to access the V-Safe system.   Mr. Burkey was instructed to call 911 with any severe reactions post vaccine: Marland Kitchen Difficulty breathing  . Swelling of face and throat  . A fast heartbeat  . A bad rash all over body  . Dizziness and weakness   Immunizations Administered    Name Date Dose VIS Date Route   Pfizer COVID-19 Vaccine 08/01/2019  4:49 PM 0.3 mL 04/12/2019 Intramuscular   Manufacturer: [employer], [employer]   Lot: [id number]   NDC: [address]-7

## 2019-08-26 ENCOUNTER — Ambulatory Visit: Payer: Federal, State, Local not specified - PPO | Attending: Internal Medicine

## 2019-08-26 DIAGNOSIS — Z23 Encounter for immunization: Secondary | ICD-10-CM

## 2019-08-26 NOTE — Progress Notes (Signed)
   Covid-19 Vaccination Clinic  Name:  CHELSEA NUSZ    MRN: [id number] DOB: [birth year]  08/26/2019  Mr. Colburn was observed post Covid-19 immunization for 15 minutes without incident. He was provided with Vaccine Information Sheet and instruction to access the V-Safe system.   Mr. Frappier was instructed to call 911 with any severe reactions post vaccine: Marland Kitchen Difficulty breathing  . Swelling of face and throat  . A fast heartbeat  . A bad rash all over body  . Dizziness and weakness   Immunizations Administered    Name Date Dose VIS Date Route   Pfizer COVID-19 Vaccine 08/26/2019 11:49 AM 0.3 mL 06/26/2018 Intramuscular   Manufacturer: [employer], [employer]   Lot: [id number]   NDC: [address]-4

## 2019-11-28 ENCOUNTER — Other Ambulatory Visit (HOSPITAL_COMMUNITY): Payer: Self-pay | Admitting: Neurological Surgery

## 2019-11-28 ENCOUNTER — Other Ambulatory Visit: Payer: Self-pay | Admitting: Neurological Surgery

## 2019-11-28 DIAGNOSIS — M21372 Foot drop, left foot: Secondary | ICD-10-CM

## 2019-12-14 ENCOUNTER — Other Ambulatory Visit: Payer: Self-pay

## 2019-12-14 ENCOUNTER — Ambulatory Visit
Admission: RE | Admit: 2019-12-14 | Discharge: 2019-12-14 | Disposition: A | Discharge status: Home / Self Care | Payer: Federal, State, Local not specified - PPO | Source: Ambulatory Visit | Attending: Neurological Surgery | Admitting: Neurological Surgery

## 2019-12-14 DIAGNOSIS — M21372 Foot drop, left foot: Secondary | ICD-10-CM | POA: Insufficient documentation

## 2022-02-12 IMAGING — MR MR LUMBAR SPINE W/O CM
5 series · 30 of 48 positions shown · non-contrast
Comparison: None available.

CLINICAL DATA: Initial evaluation for 2 month history of left foot
drop with numbness on top of left foot.

EXAM:
MRI LUMBAR SPINE WITHOUT CONTRAST
TECHNIQUE: Multiplanar, multisequence MR imaging of the lumbar spine was
performed. No intravenous contrast was administered.

[Series 5: T2 · sagittal · 4.0mm · 0.94mm/px · 6 of 17 slices shown (1 of 2)]
[im 1/17]
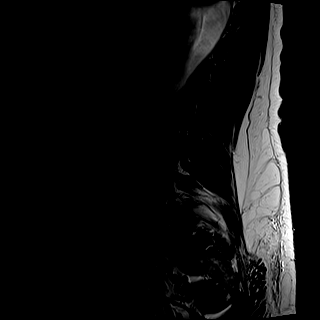
[im 4/17]
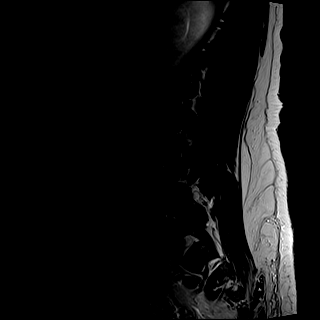
[im 7/17]
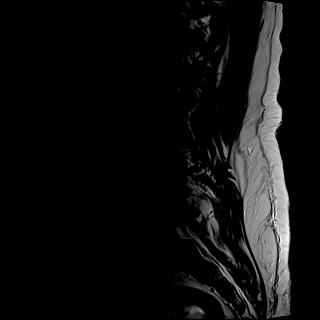
[im 10/17]
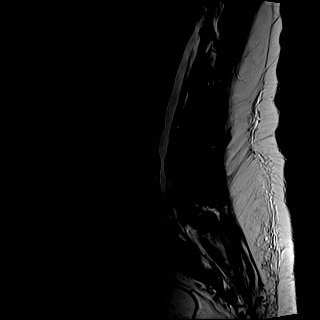
[im 13/17]
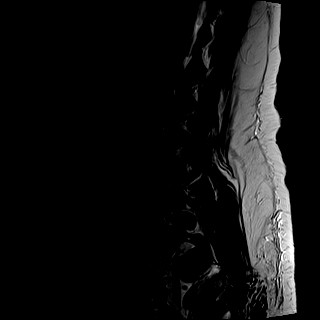
[im 17/17]
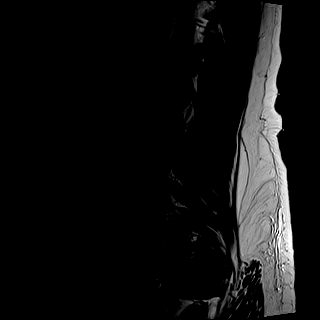

[Series 6: T1 · sagittal · 4.0mm · 0.94mm/px · 7 of 17 slices shown (1 of 2)]
[im 1/17]
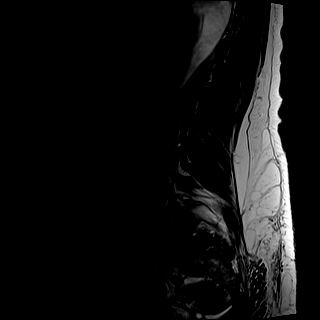
[im 3/17]
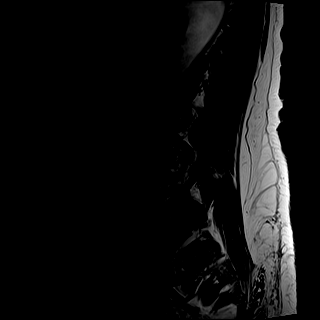
[im 6/17]
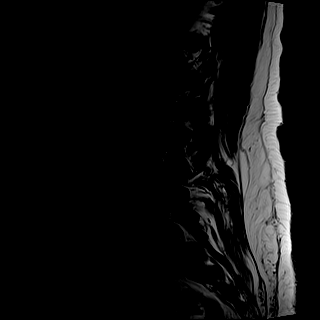
[im 9/17]
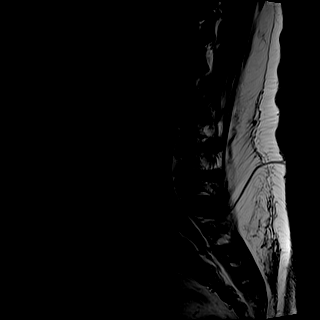
[im 11/17]
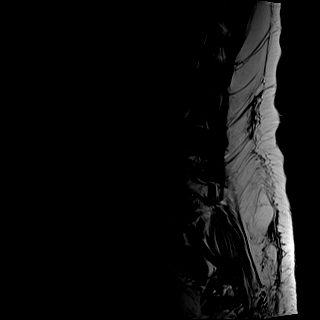
[im 14/17]
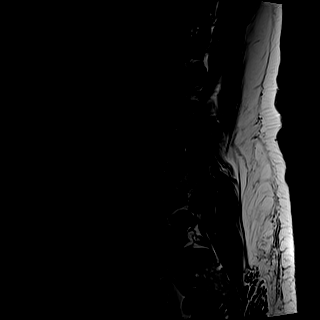
[im 17/17]
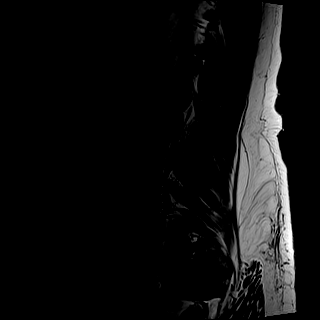

[Series 7: STIR · sagittal · 4.0mm · 0.47mm/px · 1 of 17 slices shown]
[im 1/17]
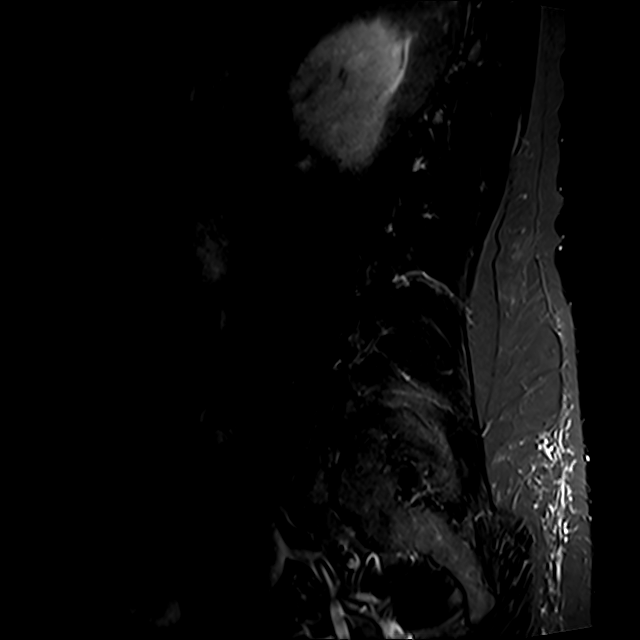

[Series 8: T2 · axial · 4.0mm · 0.78mm/px · z∈[-5,+208]mm · 8 of 37 slices shown (2 of 2)]
[im 1/37]
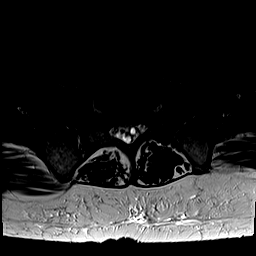
[im 6/37]
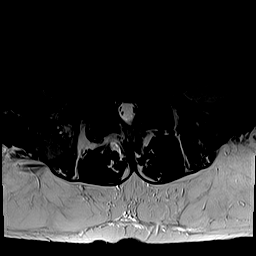
[im 12/37]
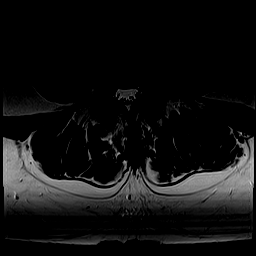
[im 17/37]
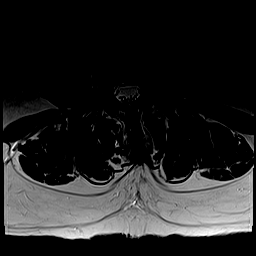
[im 20/37]
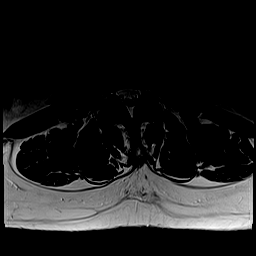
[im 25/37]
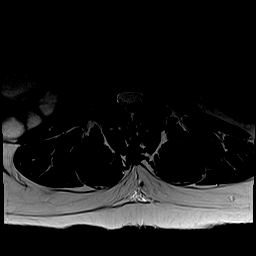
[im 31/37]
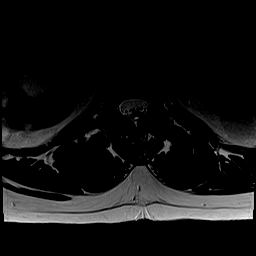
[im 37/37]
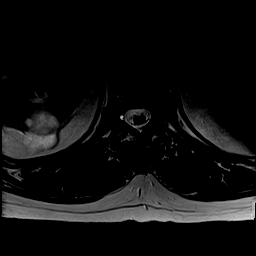

[Series 9: T1 · axial · 4.0mm · 0.39mm/px · z∈[-5,+208]mm · 8 of 37 slices shown (2 of 2)]
[im 1/37]
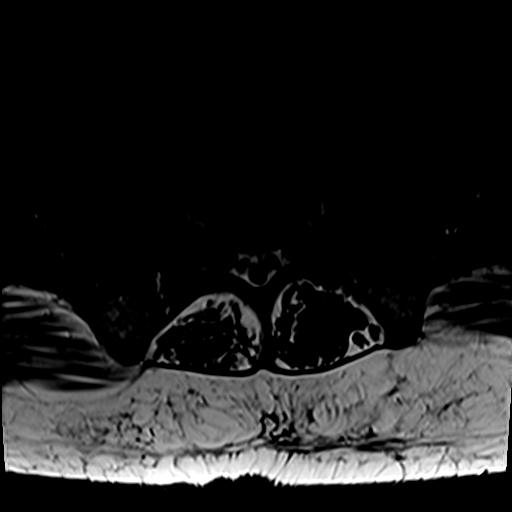
[im 6/37]
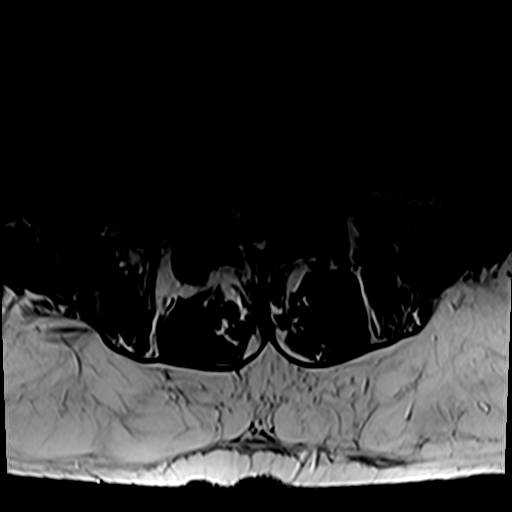
[im 12/37]
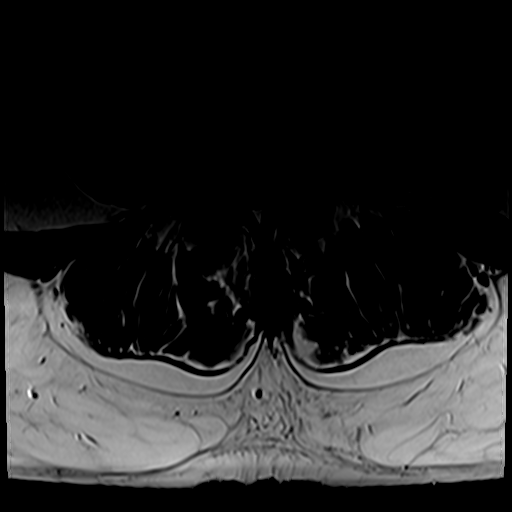
[im 17/37]
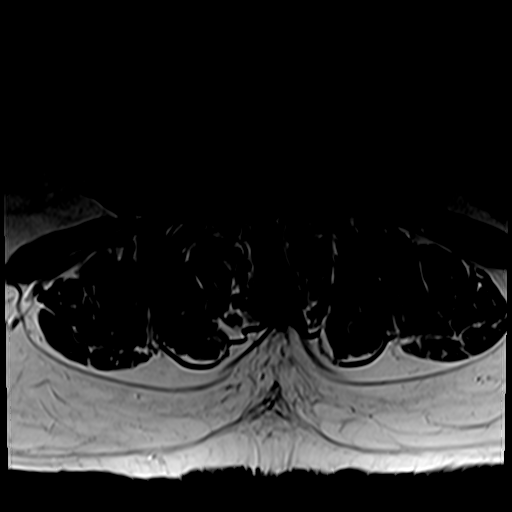
[im 20/37]
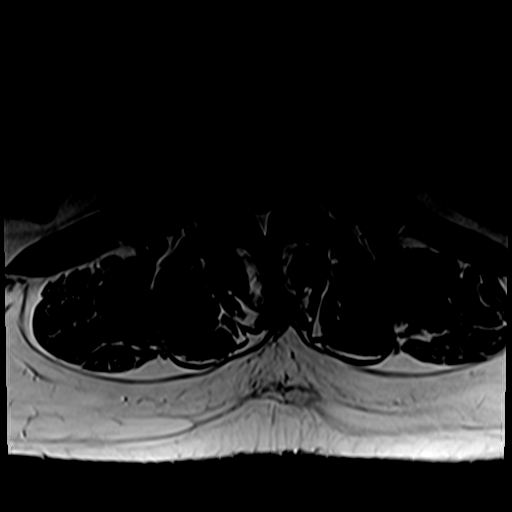
[im 25/37]
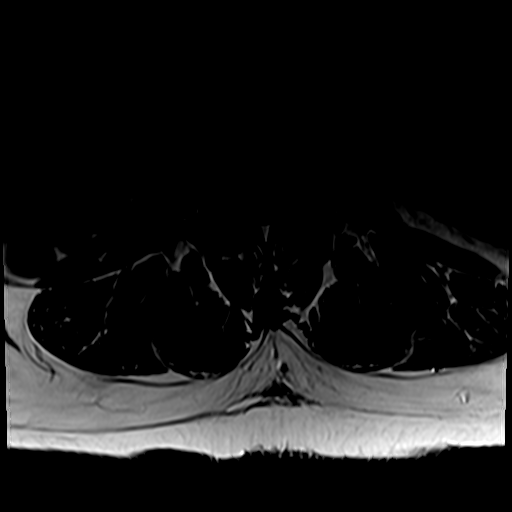
[im 31/37]
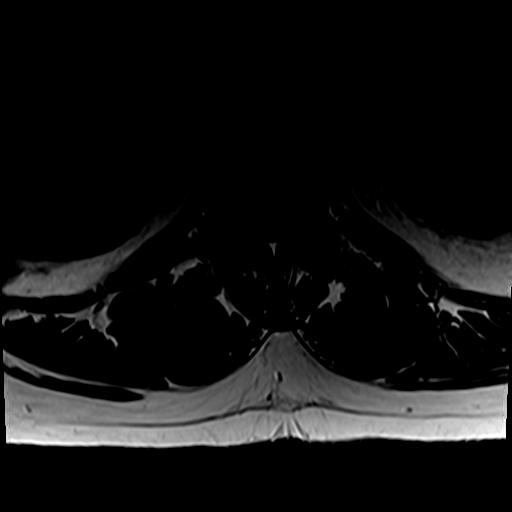
[im 37/37]
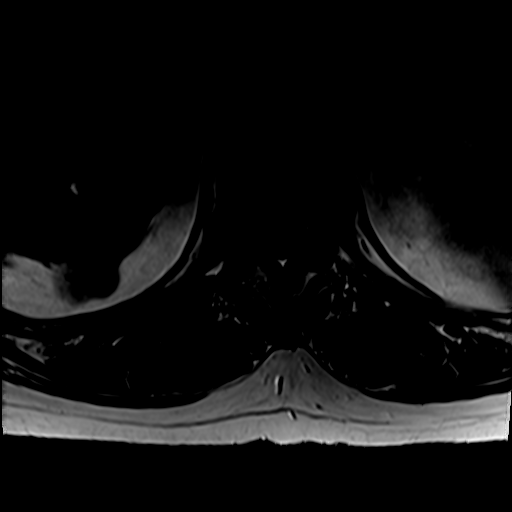

[30 of 48 positions shown; findings below may reference images not displayed]

FINDINGS: Segmentation:  Standard.

Alignment: 3 mm anterolisthesis of L5 on S1, chronic and facet
mediated. Alignment otherwise normal with preservation of the normal
lumbar lordosis.

Vertebrae: Vertebral body height maintained without acute or chronic
fracture. Bone marrow signal intensity somewhat diffusely decreased
on T1 weighted imaging, nonspecific, but most commonly related to
anemia, smoking, or obesity. 15 mm benign hemangioma noted within
the L4 vertebral body. Few additional scattered subcentimeter benign
hemangiomata noted. No worrisome osseous lesions. No abnormal marrow
edema.

Conus medullaris and cauda equina: Conus extends to the L1 level.
Conus and cauda equina appear normal.

Paraspinal and other soft tissues: Paraspinous soft tissues within
normal limits. Innumerable scattered cysts noted within the
visualized kidneys bilaterally. Visualized visceral structures
otherwise unremarkable.

Disc levels:

T11-12: No significant disc bulge. Posterior element hypertrophy. No
significant stenosis.

T12-L1: Negative interspace.  Mild facet hypertrophy.  No stenosis.

L1-2: Shallow left foraminal to extraforaminal disc protrusion with
annular fissure (series 8, image 11). No significant spinal
stenosis. Foramina remain patent.

L2-3: Mild diffuse disc bulge with disc desiccation. Mild bilateral
facet hypertrophy. No significant spinal stenosis. Foramina remain
patent.

L3-4: Degenerative intervertebral disc space narrowing with mild
diffuse disc bulge and disc desiccation. Superimposed small right
foraminal to extraforaminal disc protrusion contacts the exiting
right L3 nerve root as it courses of the right neural foramen
(series 9, image 24). Moderate facet hypertrophy. Resultant mild
bilateral lateral recess stenosis, left worse than right. Central
canal remains patent. No significant foraminal encroachment.

L4-5: Diffuse disc bulge with disc desiccation. Superimposed central
disc protrusion with annular fissure. Moderate facet and ligament
flavum hypertrophy. Resultant moderate canal with moderate bilateral
lateral recess stenosis. Either of the descending L5 nerve roots
could be affected. Mild bilateral L4 foraminal stenosis.

L5-S1: Anterolisthesis. Chronic intervertebral disc space narrowing
with diffuse disc bulge and disc desiccation. Associated mild
reactive endplate spurring. Superimposed small central disc
protrusion contacts the ventral thecal sac (series 8, image 33).
Moderate bilateral facet hypertrophy. 6 mm synovial cyst noted at
the posterior right facet (series 8, image 33). This is
noncompressive. Resultant mild narrowing of the left lateral recess.
Central canal remains patent. Mild bilateral L5 foraminal stenosis.
IMPRESSION: 1. Multifactorial degenerative changes at L4-5 with resultant
moderate canal and bilateral lateral recess stenosis. Either of the
descending L5 nerve roots could be affected.
2. Disc bulging with facet hypertrophy at L5-S1 with resultant mild
left lateral recess stenosis, descending left S1 nerve root level.
3. Small left foraminal to extraforaminal disc protrusion at L1-2,
closely approximating the exiting left L1 nerve root.
4. Small right foraminal to extraforaminal disc protrusion at L3-4,
contacting and potentially irritating the exiting right L3 nerve
root.

## 2022-09-29 ENCOUNTER — Other Ambulatory Visit: Payer: Self-pay | Admitting: Nephrology

## 2022-09-29 DIAGNOSIS — L129 Pemphigoid, unspecified: Secondary | ICD-10-CM

## 2022-09-29 DIAGNOSIS — R8290 Unspecified abnormal findings in urine: Secondary | ICD-10-CM

## 2022-09-29 DIAGNOSIS — K219 Gastro-esophageal reflux disease without esophagitis: Secondary | ICD-10-CM

## 2022-09-29 DIAGNOSIS — E668 Other obesity: Secondary | ICD-10-CM

## 2022-09-29 DIAGNOSIS — E785 Hyperlipidemia, unspecified: Secondary | ICD-10-CM

## 2022-10-10 ENCOUNTER — Ambulatory Visit
Admission: RE | Admit: 2022-10-10 | Discharge: 2022-10-10 | Disposition: A | Discharge status: Home / Self Care | Payer: Federal, State, Local not specified - PPO | Source: Ambulatory Visit | Attending: Nephrology | Admitting: Nephrology

## 2022-10-10 DIAGNOSIS — L129 Pemphigoid, unspecified: Secondary | ICD-10-CM | POA: Insufficient documentation

## 2022-10-10 DIAGNOSIS — E785 Hyperlipidemia, unspecified: Secondary | ICD-10-CM | POA: Diagnosis present

## 2022-10-10 DIAGNOSIS — E668 Other obesity: Secondary | ICD-10-CM | POA: Insufficient documentation

## 2022-10-10 DIAGNOSIS — R8290 Unspecified abnormal findings in urine: Secondary | ICD-10-CM | POA: Insufficient documentation

## 2022-10-10 DIAGNOSIS — K219 Gastro-esophageal reflux disease without esophagitis: Secondary | ICD-10-CM | POA: Diagnosis present

## 2022-10-10 MED ORDER — GADOBUTROL 1 MMOL/ML IV SOLN
10.0000 mL | Freq: Once | INTRAVENOUS | Status: AC | PRN
  Administered 2022-10-10: 10 mL via INTRAVENOUS

## 2023-05-31 ENCOUNTER — Encounter: Payer: Self-pay | Admitting: Emergency Medicine

## 2023-10-18 ENCOUNTER — Ambulatory Visit
Admission: EM | Admit: 2023-10-18 | Discharge: 2023-10-18 | Disposition: A | Discharge status: Home / Self Care | Attending: Family Medicine | Admitting: Family Medicine

## 2023-10-18 ENCOUNTER — Ambulatory Visit (INDEPENDENT_AMBULATORY_CARE_PROVIDER_SITE_OTHER)

## 2023-10-18 DIAGNOSIS — S92015A Nondisplaced fracture of body of left calcaneus, initial encounter for closed fracture: Secondary | ICD-10-CM

## 2023-10-18 DIAGNOSIS — W11XXXA Fall on and from ladder, initial encounter: Secondary | ICD-10-CM | POA: Diagnosis not present

## 2023-10-18 DIAGNOSIS — S92002A Unspecified fracture of left calcaneus, initial encounter for closed fracture: Secondary | ICD-10-CM | POA: Diagnosis not present

## 2023-10-18 MED ORDER — KETOROLAC TROMETHAMINE 60 MG/2ML IM SOLN
30.0000 mg | Freq: Once | INTRAMUSCULAR | Status: AC
  Administered 2023-10-18: 30 mg via INTRAMUSCULAR

## 2023-10-18 MED ORDER — HYDROCODONE-ACETAMINOPHEN 5-325 MG PO TABS: 1.0000 | ORAL_TABLET | ORAL | 0 refills | Status: AC | PRN

## 2023-10-18 NOTE — Discharge Instructions (Addendum)
 Follow up with [facility] or Dr Althea Atkinson for fracture management. Discussed films with podiatrist Dr Larey Plenty at [place].   Call [facility] at [facility]. [place],  [place]  [address] Main: [phone]

## 2023-10-18 NOTE — ED Provider Notes (Signed)
 [facility]    CSN: [id number] Arrival date & time: 10/18/23  1802      History   Chief Complaint Chief Complaint  Patient presents with   Fall    HPI  HPI Raymond Cooley is a 63 y.o. male.   Raymond Cooley presents for left foot pain after falling off a ladder. He was about 8 feet up and the ladder slid. He denies loss of consciousness or hitting his head.  He was not able to get up. He crawled on his knees into the garage to get an old crutch. This was an unwitnessed fall. Not able to put any weight on his foot. No treatments prior to arrival. He has sprained this ankle several times.    Past Medical History:  Diagnosis Date   Arthritis    Complication of anesthesia    difficulty voiding   GERD (gastroesophageal reflux disease)    Hyperlipemia    Hypertension    Neuromuscular disorder (HCC)    carpal tunnel    Patient Active Problem List   Diagnosis Date Noted   Acute on chronic cholecystitis 06/28/2011    Past Surgical History:  Procedure Laterality Date   CARPAL TUNNEL RELEASE  05/19/2011   Procedure: CARPAL TUNNEL RELEASE;  Surgeon: Amelie Baize., MD;  Location: [facility];  Service: Orthopedics;  Laterality: Right;   CHOLECYSTECTOMY  06/07/2011   CHOLECYSTECTOMY  06/07/2011   Procedure: LAPAROSCOPIC CHOLECYSTECTOMY;  Surgeon: Cloyce Darby, MD;  Location: [facility];  Service: General;  Laterality: N/A;   HERNIA REPAIR  2011   rupured umb hernia-   MASTECTOMY     PAROTIDECTOMY  2002   rt       Home Medications    Prior to Admission medications   Medication Sig Start Date End Date Taking? Authorizing Provider  dapsone 100 MG tablet Take 50 mg by mouth. 08/24/23 02/20/24 Yes [provider]  HYDROcodone -acetaminophen  (NORCO/VICODIN) 5-325 MG tablet Take 1 tablet by mouth every 4 (four) hours as needed. 10/18/23  Yes Nalaysia Manganiello, DO  lisinopril -hydrochlorothiazide  (PRINZIDE ,ZESTORETIC ) 10-12.5 MG per tablet Take 1  tablet by mouth daily.   Yes [provider]  omeprazole (PRILOSEC) 20 MG capsule Take 20 mg by mouth daily.   Yes [provider]  docusate sodium (COLACE) 100 MG capsule Take 200 mg by mouth 2 (two) times daily as needed. For constipation    [provider]  Fenofibrate (TRICOR PO) Take by mouth daily. Patient unsure of dosage.    [provider]  gabapentin  (NEURONTIN ) 400 MG capsule Take 400 mg by mouth 2 times daily at 12 noon and 4 pm.    [provider]  ibuprofen (ADVIL,MOTRIN) 400 MG tablet Take 400 mg by mouth every 6 (six) hours as needed. For pain    [provider]  polyethylene glycol (MIRALAX / GLYCOLAX) packet Take 17 g by mouth daily.    [provider]    Family History History reviewed. No pertinent family history.  Social History Social History   Tobacco Use   Smoking status: Some Days    Current packs/day: 0.25    Average packs/day: 0.3 packs/day for 25.0 years (6.3 ttl pk-yrs)    Types: Cigarettes   Smokeless tobacco: Former  Substance Use Topics   Alcohol use: Yes    Comment: occ   Drug use: No     Allergies   Patient has no known allergies.   Review of Systems Review  of Systems: :negative unless otherwise stated in HPI.      Physical Exam Triage Vital Signs ED Triage Vitals  Encounter Vitals Group     BP 10/18/23 1823 137/83     Girls Systolic BP Percentile --      Girls Diastolic BP Percentile --      Boys Systolic BP Percentile --      Boys Diastolic BP Percentile --      Pulse Rate 10/18/23 1823 68     Resp 10/18/23 1823 17     Temp 10/18/23 1823 98.1 F (36.7 C)     Temp Source 10/18/23 1823 Oral     SpO2 10/18/23 1823 97 %     Weight --      Height --      Head Circumference --      Peak Flow --      Pain Score 10/18/23 1821 8     Pain Loc --      Pain Education --      Exclude from Growth Chart --    No data found.  Updated Vital Signs BP 137/83 (BP Location:  Right Arm)   Pulse 68   Temp 98.1 F (36.7 C) (Oral)   Resp 17   SpO2 97%   Visual Acuity Right Eye Distance:   Left Eye Distance:   Bilateral Distance:    Right Eye Near:   Left Eye Near:    Bilateral Near:     Physical Exam GEN: well appearing male in no acute distress  CVS: well perfused, palpable DP pulse  RESP: speaking in full sentences without pause, no respiratory distress  MSK:  Lower leg/Ankle/Foot, left: TTP noted at the distal tibia, bilateral malleoli, heel, metatarsals with erythema, swelling, ecchymosis. No bony deformity. +decreased sensation of toes.  Notable pes planus deformity. Limited range of motion  in all directions of ankle 2/2 to pain and edema. Strength not assessed. Tenderness at the insertion/body/myotendinous junction of the Achilles tendon; Tenderness on posterior aspects of lateral and medial malleolus; Talar dome tender; Pain at base of 5th MT; Tenderness at the distal metatarsals; Unable to walk 4 steps.  SKIN: bruising to medial heel extending along the lateral foot border, abrasion to left knee   UC Treatments / Results  Labs (all labs ordered are listed, but only abnormal results are displayed) Labs Reviewed - No data to display  EKG   Radiology DG Tibia/Fibula Left Result Date: 10/18/2023 CLINICAL DATA:  fall from ladder, pain, swelling EXAM: LEFT TIBIA AND FIBULA - 2 VIEW COMPARISON:  Left ankle 10/18/2023 FINDINGS: There is no evidence of fracture or other focal bone lesions. Knee is grossly unremarkable. Ankle subcutaneus soft tissue edema. IMPRESSION: No acute displaced fracture or dislocation. Electronically Signed   By: Morgane  Naveau M.D.   On: 10/18/2023 19:27   DG Ankle Complete Left Result Date: 10/18/2023 CLINICAL DATA:  fall from ladder, pain, swelling EXAM: LEFT ANKLE COMPLETE - 3+ VIEW; LEFT FOOT - COMPLETE 3+ VIEW COMPARISON:  None Available. FINDINGS: There is no evidence of fracture, dislocation, or joint effusion.  Posterior and plantar calcaneal spur. There is no evidence of arthropathy or other focal bone abnormality. Ankle subcutaneus soft tissue edema. IMPRESSION: No acute displaced fracture or dislocation the bones of the left foot and ankle. Electronically Signed   By: Morgane  Naveau M.D.   On: 10/18/2023 19:24   DG Foot Complete Left Result Date: 10/18/2023 CLINICAL DATA:  fall from ladder, pain, swelling  EXAM: LEFT ANKLE COMPLETE - 3+ VIEW; LEFT FOOT - COMPLETE 3+ VIEW COMPARISON:  None Available. FINDINGS: There is no evidence of fracture, dislocation, or joint effusion. Posterior and plantar calcaneal spur. There is no evidence of arthropathy or other focal bone abnormality. Ankle subcutaneus soft tissue edema. IMPRESSION: No acute displaced fracture or dislocation the bones of the left foot and ankle. Electronically Signed   By: Morgane  Naveau M.D.   On: 10/18/2023 19:24     Procedures Procedures (including critical care time)  Medications Ordered in UC Medications  ketorolac (TORADOL) injection 30 mg (30 mg Intramuscular Given 10/18/23 1910)    Initial Impression / Assessment and Plan / UC Course  I have reviewed the triage vital signs and the nursing notes.  Pertinent labs & imaging results that were available during my care of the patient were reviewed by me and considered in my medical decision making (see chart for details).      Pt is a 63 y.o.  male who presents after a fall off a ladder an hour prior to arrival with left foot and ankle pain.  Discussed po vs IM pain control and he prefers IM pain control. Given Toradol 30 mg IM. Recent serum creatine was normal. On exam, pt has tenderness diffusely across the ankle and mid to hind foot concerning for fractures.     Obtained tib-fib, ankle and foot plain films.  Personally interpreted by me were concerning for possible calcaneal fracture. Given the weight time for xray reached out to on-call podiatrist Dr Pink Bridges who reviewed  the films and noted  definitely calcaneal fracture and possible cuneiform fracture. Recommended pt be non-weight bearing for 6 weeks. Placed in CAM boot and given crutches. Referral placed to podiatry.   Patient to gradually return to normal activities, as tolerated and continue ordinary activities within the limits permitted by pain. Prescribed Norco for acute fracture pain relief. Motrin PRN.  Patient to follow up with podiatrist as he may need a CT of his heel and midfoot to better evaluate fracture.  Return and ED precautions given. Understanding voiced. Discussed MDM, treatment plan and plan for follow-up with patient who agrees with plan.   Radiologist impression reviewed. Will stick with podiatry plan despite radiologist read.   Final Clinical Impressions(s) / UC Diagnoses   Final diagnoses:  Fall from ladder, initial encounter  Closed nondisplaced fracture of left calcaneus, unspecified portion of calcaneus, initial encounter     Discharge Instructions      Follow up with [facility] or Dr Althea Atkinson for fracture management. Discussed films with podiatrist Dr Larey Plenty at [facility].   Call [facility] at [facility] [place],  [place]  [address] Main: [phone]       ED Prescriptions     Medication Sig Dispense Auth. Provider   HYDROcodone -acetaminophen  (NORCO/VICODIN) 5-325 MG tablet Take 1 tablet by mouth every 4 (four) hours as needed. 12 tablet Makenzee Choudhry, DO      I have reviewed the PDMP during this encounter.   Kanylah Muench, DO 10/18/23 2003

## 2023-10-18 NOTE — ED Triage Notes (Addendum)
 Patient fell 7-8 ft off of a ladder and injured his left foot. No other injuries. Patient didn't hit head.

## 2024-03-22 ENCOUNTER — Encounter: Payer: Self-pay | Admitting: Emergency Medicine

## 2024-03-22 ENCOUNTER — Ambulatory Visit
Admission: EM | Admit: 2024-03-22 | Discharge: 2024-03-22 | Disposition: A | Discharge status: Home / Self Care | Attending: Emergency Medicine | Admitting: Emergency Medicine

## 2024-03-22 DIAGNOSIS — L03115 Cellulitis of right lower limb: Secondary | ICD-10-CM

## 2024-03-22 MED ORDER — CEPHALEXIN 500 MG PO CAPS: 500.0000 mg | ORAL_CAPSULE | Freq: Four times a day (QID) | ORAL | 0 refills | Status: AC

## 2024-03-22 NOTE — Discharge Instructions (Signed)
 Take Keflex  as directed Rest, elevate right leg If you develop fever, nausea, unable to keep antibiotic down, go to the ER for further evaluation may need IV antibiotics at that time.

## 2024-03-22 NOTE — ED Triage Notes (Signed)
 Pt c/o right leg pain. Started about 7 days ago. He states he hit his leg on a limb. He has a lump on his lower leg, redness, tenderness and warn tot he touch.

## 2024-03-22 NOTE — ED Provider Notes (Signed)
 [facility]    CSN: [id number] Arrival date & time: 03/22/24  1558      History   Chief Complaint Chief Complaint  Patient presents with   Leg Pain    right    HPI Raymond Cooley is a 63 y.o. male.   Raymond Cooley, 63 year old male pt, presents to urgent care for evaluation of right leg injury.  Patient reports redness, hot to touch,tenderness x 3 days after hitting leg on tree limb. No pain with ambulation.  The history is provided by the patient. No language interpreter was used.    Past Medical History:  Diagnosis Date   Arthritis    Complication of anesthesia    difficulty voiding   GERD (gastroesophageal reflux disease)    Hyperlipemia    Hypertension    Neuromuscular disorder (HCC)    carpal tunnel    Patient Active Problem List   Diagnosis Date Noted   Cellulitis of right lower extremity 03/22/2024   Acute on chronic cholecystitis 06/28/2011    Past Surgical History:  Procedure Laterality Date   CARPAL TUNNEL RELEASE  05/19/2011   Procedure: CARPAL TUNNEL RELEASE;  Surgeon: Lamar LULLA Leonor Mickey., MD;  Location: [facility];  Service: Orthopedics;  Laterality: Right;   CHOLECYSTECTOMY  06/07/2011   CHOLECYSTECTOMY  06/07/2011   Procedure: LAPAROSCOPIC CHOLECYSTECTOMY;  Surgeon: Dann FORBES Hummer, MD;  Location: [facility];  Service: General;  Laterality: N/A;   HERNIA REPAIR  2011   rupured umb hernia-   MASTECTOMY     PAROTIDECTOMY  2002   rt       Home Medications    Prior to Admission medications   Medication Sig Start Date End Date Taking? Authorizing Provider  aspirin EC 81 MG tablet Take 81 mg by mouth daily. Swallow whole.   Yes [provider]  cephALEXin  (KEFLEX ) 500 MG capsule Take 1 capsule (500 mg total) by mouth 4 (four) times daily for 7 days. 03/22/24 03/29/24 Yes Jarell Mcewen, Rilla, NP  gabapentin  (NEURONTIN ) 400 MG capsule Take 400 mg by mouth 2 times daily at 12 noon and 4 pm.   Yes [provider]  ibuprofen (ADVIL,MOTRIN) 400 MG tablet Take 400 mg by mouth every 6 (six) hours as needed. For pain   Yes [provider]  lisinopril -hydrochlorothiazide  (PRINZIDE ,ZESTORETIC ) 10-12.5 MG per tablet Take 1 tablet by mouth daily.   Yes [provider]  omeprazole (PRILOSEC) 20 MG capsule Take 20 mg by mouth daily.   Yes [provider]  polyethylene glycol (MIRALAX / GLYCOLAX) packet Take 17 g by mouth daily.   Yes [provider]  dapsone 100 MG tablet Take by mouth.    [provider]  docusate sodium (COLACE) 100 MG capsule Take 200 mg by mouth 2 (two) times daily as needed. For constipation    [provider]  Fenofibrate (TRICOR PO) Take by mouth daily. Patient unsure of dosage.    [provider]  HYDROcodone -acetaminophen  (NORCO/VICODIN) 5-325 MG tablet Take 1 tablet by mouth every 4 (four) hours as needed. 10/18/23   Brimage, Vondra, DO    Family History History reviewed. No pertinent family history.  Social History Social History   Tobacco Use   Smoking status: Some Days    Current packs/day: 0.25    Average packs/day: 0.3 packs/day for 25.0 years (6.3 ttl pk-yrs)    Types: Cigarettes   Smokeless tobacco: Former  [employer]   Vaping status: Never  Used  Substance Use Topics   Alcohol use: Yes    Comment: occ   Drug use: No     Allergies   Patient has no known allergies.   Review of Systems Review of Systems  Constitutional:  Negative for fever.  Skin:  Positive for color change and wound.  All other systems reviewed and are negative.    Physical Exam Triage Vital Signs ED Triage Vitals  Encounter Vitals Group     BP      Girls Systolic BP Percentile      Girls Diastolic BP Percentile      Boys Systolic BP Percentile      Boys Diastolic BP Percentile      Pulse      Resp      Temp      Temp src      SpO2      Weight      Height      Head Circumference      Peak Flow       Pain Score      Pain Loc      Pain Education      Exclude from Growth Chart    No data found.  Updated Vital Signs BP (!) 164/89 (BP Location: Right Arm)   Pulse 63   Temp 97.7 F (36.5 C) (Oral)   Resp 18   Ht 5' 11 (1.803 m)   Wt 294 lb 15.6 oz (133.8 kg)   SpO2 96%   BMI 41.14 kg/m   Visual Acuity Right Eye Distance:   Left Eye Distance:   Bilateral Distance:    Right Eye Near:   Left Eye Near:    Bilateral Near:     Physical Exam   UC Treatments / Results  Labs (all labs ordered are listed, but only abnormal results are displayed) Labs Reviewed - No data to display  EKG   Radiology No results found.  Procedures Procedures (including critical care time)  Medications Ordered in UC Medications - No data to display  Initial Impression / Assessment and Plan / UC Course  I have reviewed the triage vital signs and the nursing notes.  Pertinent labs & imaging results that were available during my care of the patient were reviewed by me and considered in my medical decision making (see chart for details).    Discussed exam findings and plan of care with patient, scripted keflex , strict go to ER precautions given.   Patient verbalized understanding to this provider.  Ddx: Cellulitis, contusion Final Clinical Impressions(s) / UC Diagnoses   Final diagnoses:  Cellulitis of right lower extremity     Discharge Instructions      Take Keflex  as directed Rest, elevate right leg If you develop fever, nausea, unable to keep antibiotic down, go to the ER for further evaluation may need IV antibiotics at that time.     ED Prescriptions     Medication Sig Dispense Auth. Provider   cephALEXin  (KEFLEX ) 500 MG capsule Take 1 capsule (500 mg total) by mouth 4 (four) times daily for 7 days. 28 capsule Amariya Liskey, NP      PDMP not reviewed this encounter.   Aminta Loose, NP 03/22/24 2007
